# Patient Record
Sex: Male | Born: 1955 | Race: White | Hispanic: No | Marital: Single | Smoking: Never smoker
Health system: Southern US, Community
[De-identification: ages and names within clinical notes are randomized; demographics above are authoritative.]

## PROBLEM LIST (undated history)

## (undated) DIAGNOSIS — L409 Psoriasis, unspecified: Secondary | ICD-10-CM

## (undated) DIAGNOSIS — F32A Depression, unspecified: Secondary | ICD-10-CM

## (undated) DIAGNOSIS — I10 Essential (primary) hypertension: Secondary | ICD-10-CM

## (undated) DIAGNOSIS — J449 Chronic obstructive pulmonary disease, unspecified: Secondary | ICD-10-CM

## (undated) DIAGNOSIS — E119 Type 2 diabetes mellitus without complications: Secondary | ICD-10-CM

## (undated) DIAGNOSIS — G4733 Obstructive sleep apnea (adult) (pediatric): Secondary | ICD-10-CM

## (undated) HISTORY — DX: Chronic obstructive pulmonary disease, unspecified: J44.9

## (undated) HISTORY — DX: Obstructive sleep apnea (adult) (pediatric): G47.33

## (undated) HISTORY — PX: HERNIA REPAIR: SHX51

## (undated) HISTORY — DX: Psoriasis, unspecified: L40.9

## (undated) HISTORY — DX: Depression, unspecified: F32.A

---

## 2021-03-10 ENCOUNTER — Encounter (INDEPENDENT_AMBULATORY_CARE_PROVIDER_SITE_OTHER): Payer: Self-pay | Admitting: *Deleted

## 2021-06-13 ENCOUNTER — Encounter (INDEPENDENT_AMBULATORY_CARE_PROVIDER_SITE_OTHER): Payer: Self-pay | Admitting: *Deleted

## 2021-06-15 ENCOUNTER — Encounter (HOSPITAL_COMMUNITY)
Admission: EM | Disposition: A | Discharge status: Home / Self Care | Payer: Self-pay | Source: Home / Self Care | Attending: Emergency Medicine

## 2021-06-15 ENCOUNTER — Encounter (HOSPITAL_COMMUNITY): Payer: Self-pay

## 2021-06-15 ENCOUNTER — Emergency Department (HOSPITAL_COMMUNITY): Payer: Medicare (Managed Care) | Admitting: Anesthesiology

## 2021-06-15 ENCOUNTER — Ambulatory Visit (HOSPITAL_COMMUNITY)
Admission: EM | Admit: 2021-06-15 | Discharge: 2021-06-15 | Disposition: A | Discharge status: Home / Self Care | Payer: Medicare (Managed Care) | Attending: Emergency Medicine | Admitting: Emergency Medicine

## 2021-06-15 ENCOUNTER — Other Ambulatory Visit (INDEPENDENT_AMBULATORY_CARE_PROVIDER_SITE_OTHER): Payer: Self-pay | Admitting: Gastroenterology

## 2021-06-15 DIAGNOSIS — T18128A Food in esophagus causing other injury, initial encounter: Secondary | ICD-10-CM | POA: Diagnosis present

## 2021-06-15 DIAGNOSIS — K269 Duodenal ulcer, unspecified as acute or chronic, without hemorrhage or perforation: Secondary | ICD-10-CM | POA: Insufficient documentation

## 2021-06-15 DIAGNOSIS — Z79899 Other long term (current) drug therapy: Secondary | ICD-10-CM | POA: Insufficient documentation

## 2021-06-15 DIAGNOSIS — K219 Gastro-esophageal reflux disease without esophagitis: Secondary | ICD-10-CM | POA: Insufficient documentation

## 2021-06-15 DIAGNOSIS — K3189 Other diseases of stomach and duodenum: Secondary | ICD-10-CM | POA: Diagnosis not present

## 2021-06-15 DIAGNOSIS — Z7984 Long term (current) use of oral hypoglycemic drugs: Secondary | ICD-10-CM | POA: Insufficient documentation

## 2021-06-15 DIAGNOSIS — K209 Esophagitis, unspecified without bleeding: Secondary | ICD-10-CM | POA: Diagnosis not present

## 2021-06-15 DIAGNOSIS — K2950 Unspecified chronic gastritis without bleeding: Secondary | ICD-10-CM | POA: Diagnosis not present

## 2021-06-15 DIAGNOSIS — E119 Type 2 diabetes mellitus without complications: Secondary | ICD-10-CM | POA: Diagnosis not present

## 2021-06-15 DIAGNOSIS — Z20822 Contact with and (suspected) exposure to covid-19: Secondary | ICD-10-CM | POA: Diagnosis not present

## 2021-06-15 DIAGNOSIS — T18108A Unspecified foreign body in esophagus causing other injury, initial encounter: Secondary | ICD-10-CM

## 2021-06-15 DIAGNOSIS — X58XXXA Exposure to other specified factors, initial encounter: Secondary | ICD-10-CM | POA: Insufficient documentation

## 2021-06-15 DIAGNOSIS — I10 Essential (primary) hypertension: Secondary | ICD-10-CM | POA: Diagnosis not present

## 2021-06-15 HISTORY — DX: Essential (primary) hypertension: I10

## 2021-06-15 HISTORY — PX: ESOPHAGOGASTRODUODENOSCOPY (EGD) WITH PROPOFOL: SHX5813

## 2021-06-15 HISTORY — DX: Type 2 diabetes mellitus without complications: E11.9

## 2021-06-15 HISTORY — PX: BIOPSY: SHX5522

## 2021-06-15 LAB — CBC
HCT: 43.7 % (ref 39.0–52.0)
Hemoglobin: 14.9 g/dL (ref 13.0–17.0)
MCH: 30.8 pg (ref 26.0–34.0)
MCHC: 34.1 g/dL (ref 30.0–36.0)
MCV: 90.3 fL (ref 80.0–100.0)
Platelets: 227 10*3/uL (ref 150–400)
RBC: 4.84 MIL/uL (ref 4.22–5.81)
RDW: 12.8 % (ref 11.5–15.5)
WBC: 9.8 10*3/uL (ref 4.0–10.5)
nRBC: 0 % (ref 0.0–0.2)

## 2021-06-15 LAB — RESP PANEL BY RT-PCR (FLU A&B, COVID) ARPGX2
Influenza A by PCR: NEGATIVE
Influenza B by PCR: NEGATIVE
SARS Coronavirus 2 by RT PCR: NEGATIVE

## 2021-06-15 LAB — BASIC METABOLIC PANEL
Anion gap: 10 (ref 5–15)
BUN: 14 mg/dL (ref 8–23)
CO2: 21 mmol/L — ABNORMAL LOW (ref 22–32)
Calcium: 9 mg/dL (ref 8.9–10.3)
Chloride: 110 mmol/L (ref 98–111)
Creatinine, Ser: 0.76 mg/dL (ref 0.61–1.24)
GFR, Estimated: >60 mL/min (ref >60)
Glucose, Bld: 117 mg/dL — ABNORMAL HIGH (ref 70–99)
Potassium: 3.5 mmol/L (ref 3.5–5.1)
Sodium: 141 mmol/L (ref 135–145)

## 2021-06-15 SURGERY — ESOPHAGOGASTRODUODENOSCOPY (EGD) WITH PROPOFOL
Anesthesia: General

## 2021-06-15 MED ORDER — EPHEDRINE SULFATE-NACL 50-0.9 MG/10ML-% IV SOSY
PREFILLED_SYRINGE | INTRAVENOUS | Status: DC | PRN
  Administered 2021-06-15: 5 mg via INTRAVENOUS

## 2021-06-15 MED ORDER — ROCURONIUM BROMIDE 10 MG/ML (PF) SYRINGE
PREFILLED_SYRINGE | INTRAVENOUS | Status: DC | PRN
  Administered 2021-06-15: 30 mg via INTRAVENOUS

## 2021-06-15 MED ORDER — PROPOFOL 10 MG/ML IV BOLUS
INTRAVENOUS | Status: AC
  Filled 2021-06-15: qty 20

## 2021-06-15 MED ORDER — ROCURONIUM BROMIDE 10 MG/ML (PF) SYRINGE
PREFILLED_SYRINGE | INTRAVENOUS | Status: AC
  Filled 2021-06-15: qty 10

## 2021-06-15 MED ORDER — FENTANYL CITRATE (PF) 100 MCG/2ML IJ SOLN
INTRAMUSCULAR | Status: AC
  Filled 2021-06-15: qty 2

## 2021-06-15 MED ORDER — ESMOLOL HCL 100 MG/10ML IV SOLN
INTRAVENOUS | Status: DC | PRN
  Administered 2021-06-15: 40 mg via INTRAVENOUS

## 2021-06-15 MED ORDER — LIDOCAINE HCL (CARDIAC) PF 100 MG/5ML IV SOSY
PREFILLED_SYRINGE | INTRAVENOUS | Status: DC | PRN
  Administered 2021-06-15: 100 mg via INTRATRACHEAL
  Administered 2021-06-15: 30 mg via INTRATRACHEAL

## 2021-06-15 MED ORDER — DEXAMETHASONE SODIUM PHOSPHATE 4 MG/ML IJ SOLN
INTRAMUSCULAR | Status: DC | PRN
  Administered 2021-06-15: 8 mg via INTRAVENOUS

## 2021-06-15 MED ORDER — OMEPRAZOLE 40 MG PO CPDR: 40.0000 mg | DELAYED_RELEASE_CAPSULE | Freq: Two times a day (BID) | ORAL | 1 refills | Status: DC

## 2021-06-15 MED ORDER — FENTANYL CITRATE (PF) 100 MCG/2ML IJ SOLN
INTRAMUSCULAR | Status: DC | PRN
  Administered 2021-06-15: 100 ug via INTRAVENOUS

## 2021-06-15 MED ORDER — SUCCINYLCHOLINE CHLORIDE 200 MG/10ML IV SOSY
PREFILLED_SYRINGE | INTRAVENOUS | Status: AC
Start: 2021-06-15 — End: ?
  Filled 2021-06-15: qty 10

## 2021-06-15 MED ORDER — SUGAMMADEX SODIUM 500 MG/5ML IV SOLN
INTRAVENOUS | Status: DC | PRN
  Administered 2021-06-15: 362.8 mg via INTRAVENOUS

## 2021-06-15 MED ORDER — DEXAMETHASONE SODIUM PHOSPHATE 4 MG/ML IJ SOLN
INTRAMUSCULAR | Status: AC
  Filled 2021-06-15: qty 1

## 2021-06-15 MED ORDER — GLYCOPYRROLATE PF 0.2 MG/ML IJ SOSY
PREFILLED_SYRINGE | INTRAMUSCULAR | Status: DC | PRN
  Administered 2021-06-15: .2 mg via INTRAVENOUS

## 2021-06-15 MED ORDER — SUCCINYLCHOLINE CHLORIDE 200 MG/10ML IV SOSY
PREFILLED_SYRINGE | INTRAVENOUS | Status: DC | PRN
  Administered 2021-06-15: 200 mg via INTRAVENOUS

## 2021-06-15 MED ORDER — ONDANSETRON HCL 4 MG/2ML IJ SOLN
INTRAMUSCULAR | Status: DC | PRN
  Administered 2021-06-15: 4 mg via INTRAVENOUS

## 2021-06-15 MED ORDER — GLUCAGON HCL RDNA (DIAGNOSTIC) 1 MG IJ SOLR
1.0000 mg | Freq: Once | INTRAMUSCULAR | Status: AC
  Administered 2021-06-15: 15:00:00 1 mg via INTRAVENOUS
  Filled 2021-06-15: qty 1

## 2021-06-15 MED ORDER — PROPOFOL 10 MG/ML IV BOLUS
INTRAVENOUS | Status: DC | PRN
  Administered 2021-06-15: 200 mg via INTRAVENOUS

## 2021-06-15 MED ORDER — LIDOCAINE HCL (PF) 2 % IJ SOLN
INTRAMUSCULAR | Status: AC
  Filled 2021-06-15: qty 5

## 2021-06-15 MED ORDER — LACTATED RINGERS IV SOLN: INTRAVENOUS | Status: DC | PRN

## 2021-06-15 MED ORDER — PHENYLEPHRINE 40 MCG/ML (10ML) SYRINGE FOR IV PUSH (FOR BLOOD PRESSURE SUPPORT)
PREFILLED_SYRINGE | INTRAVENOUS | Status: DC | PRN
  Administered 2021-06-15: 80 ug via INTRAVENOUS

## 2021-06-15 MED ORDER — ONDANSETRON HCL 4 MG/2ML IJ SOLN
INTRAMUSCULAR | Status: AC
  Filled 2021-06-15: qty 2

## 2021-06-15 MED ORDER — LACTATED RINGERS IV BOLUS: 1000.0000 mL | Freq: Once | INTRAVENOUS | Status: DC

## 2021-06-15 NOTE — Op Note (Signed)
[facility] Patient Name: Carl Jimenez Procedure Date: 06/15/2021 5:09 PM MRN: [id number] Date of Birth: [birth year] Attending MD: Maylon Peppers ,  CSN: [id number] Age: 65 Admit Type: Inpatient Procedure:                Upper GI endoscopy Indications:              Foreign body in the esophagus Providers:                Maylon Peppers, Lurline Del, RN, Casimer Bilis, Technician Referring MD:              Medicines:                General Anesthesia Complications:            No immediate complications. Estimated Blood Loss:     Estimated blood loss: none. Procedure:                Pre-Anesthesia Assessment:                           - Prior to the procedure, a History and Physical                            was performed, and patient medications, allergies                            and sensitivities were reviewed. The patient's                            tolerance of previous anesthesia was reviewed.                           - The risks and benefits of the procedure and the                            sedation options and risks were discussed with the                            patient. All questions were answered and informed                            consent was obtained.                           After obtaining informed consent, the endoscope was                            passed under direct vision. Throughout the                            procedure, the patient's blood pressure, pulse, and                            oxygen saturations were monitored continuously. The  [id number] scope was introduced through the                            mouth, and advanced to the second part of duodenum.                            The upper GI endoscopy was accomplished without                            difficulty. The patient tolerated the procedure                            well. Scope In: 5:26:11 PM Scope  Out: 6:06:23 PM Total Procedure Duration: 0 hours 40 minutes 12 seconds  Findings:      Food was found in the lower third of the esophagus. Removal of food was       accomplished with a combination of cold snare, transparent cap and Roth       net. The last portion of the food bolus was gently pushed to the gastric       cavity without resistance.      Moderately severe esophagitis with no bleeding was found in the lower       third of the esophagus. Biopsies were obtained from the proximal and       distal esophagus with cold forceps for histology.      Diffuse mildly erythematous mucosa was found in the entire examined       stomach. Biopsies were taken with a cold forceps for Helicobacter pylori       testing.      A few localized erosions without bleeding were found in the first       portion of the duodenum. Impression:               - Food in the lower third of the esophagus. Removal                            was successful.                           - Moderately severe esophagitis with no bleeding.                            Biopsied.                           - Erythematous mucosa in the stomach. Biopsied.                           - Duodenal erosions without bleeding. Moderate Sedation:      Per Anesthesia Care Recommendation:           - Discharge patient to home (ambulatory).                           - Await pathology results.                           - Repeat upper endoscopy  in 2 months for                            surveillance.                           - Full liquid diet today, advance to a soft diet                            tomorrow - no meat, only ground meat until repeat                            endoscopy is performed.                           - Use Prilosec (omeprazole) 40 mg PO BID for 3                            months. Procedure Code(s):        --- Professional ---                           [id number], Esophagogastroduodenoscopy, flexible,                             transoral; with removal of foreign body(s)                           43239, Esophagogastroduodenoscopy, flexible,                            transoral; with biopsy, single or multiple Diagnosis Code(s):        --- Professional ---                           A83.419Q, Food in esophagus causing other injury,                            initial encounter                           K20.90, Esophagitis, unspecified without bleeding                           K31.89, Other diseases of stomach and duodenum                           K26.9, Duodenal ulcer, unspecified as acute or                            chronic, without hemorrhage or perforation                           T18.108A, Unspecified foreign body in esophagus                            causing other injury,  initial encounter CPT copyright 2019 American [employer] Association. All rights reserved. The codes documented in this report are preliminary and upon coder review may  be revised to meet current compliance requirements. Maylon Peppers, MD Maylon Peppers,  06/15/2021 6:15:06 PM This report has been signed electronically. Number of Addenda: 0

## 2021-06-15 NOTE — Anesthesia Postprocedure Evaluation (Signed)
Anesthesia Post Note  Patient: Homar Weinkauf  Procedure(s) Performed: ESOPHAGOGASTRODUODENOSCOPY (EGD) WITH PROPOFOL BIOPSY  Patient location during evaluation: PACU Anesthesia Type: General Level of consciousness: awake and alert and oriented Pain management: pain level controlled Vital Signs Assessment: post-procedure vital signs reviewed and stable Respiratory status: spontaneous breathing, nonlabored ventilation and respiratory function stable Cardiovascular status: blood pressure returned to baseline and stable Postop Assessment: no apparent nausea or vomiting Anesthetic complications: no   No notable events documented.   Last Vitals:  Vitals:   06/15/21 1827 06/15/21 1830  BP: (!) 142/93 (!) 152/89  Pulse: 73 74  Resp: 14 (!) 22  Temp: 36.5 C   SpO2: 97% 97%    Last Pain:  Vitals:   06/15/21 1827  TempSrc:   PainSc: 0-No pain                 Khadijatou Borak C Zaharah Amir

## 2021-06-15 NOTE — Anesthesia Preprocedure Evaluation (Addendum)
Anesthesia Evaluation  Patient identified by MRN, date of birth, ID band Patient awake    Reviewed: Allergy & Precautions, NPO status , Patient's Chart, lab work & pertinent test results  Airway Mallampati: II  TM Distance: >3 FB Neck ROM: Full    Dental  (+) Edentulous Upper, Edentulous Lower   Pulmonary neg pulmonary ROS,    Pulmonary exam normal breath sounds clear to auscultation       Cardiovascular hypertension, Normal cardiovascular exam Rhythm:Regular Rate:Normal     Neuro/Psych negative neurological ROS  negative psych ROS   GI/Hepatic negative GI ROS, Neg liver ROS,   Endo/Other  diabetes, Well Controlled, Type 2, Oral Hypoglycemic Agents  Renal/GU negative Renal ROS  negative genitourinary   Musculoskeletal negative musculoskeletal ROS (+)   Abdominal   Peds negative pediatric ROS (+)  Hematology negative hematology ROS (+)   Anesthesia Other Findings   Reproductive/Obstetrics negative OB ROS                             Anesthesia Physical Anesthesia Plan  ASA: 2 and emergent  Anesthesia Plan: General   Post-op Pain Management:    Induction: Intravenous, Cricoid pressure planned and Rapid sequence  PONV Risk Score and Plan: 2 and Ondansetron and Dexamethasone  Airway Management Planned: Oral ETT  Additional Equipment:   Intra-op Plan:   Post-operative Plan: Extubation in OR  Informed Consent:     Dental advisory given  Plan Discussed with: Surgeon  Anesthesia Plan Comments: (Discussed plan with patient and he gave verbal consent for anesthesia and verbal consent for the procedure was given to Dr. Amil Amen.)       Anesthesia Quick Evaluation

## 2021-06-15 NOTE — ED Provider Notes (Signed)
[facility] EMERGENCY DEPARTMENT Provider Note   CSN: [id number] Arrival date & time: 06/15/21  1356     History No chief complaint on file.   Carl Jimenez is a 65 y.o. male.  HPI     Carl Jimenez is a 65 y.o. male with past medical history of hypertension and type 2 diabetes who presents to the Emergency Department complaining of food bolus of his throat.  He states he was eating barbecue last night around 7 PM when he swallowed a piece of pork that he feels is stuck in his esophagus.  He states he has had this happen several times in the past and is scheduled to have his esophagus stretched in 3 weeks.  He attempted to drink water and tea without relief.  States he is unable to tolerate his own saliva.  Has not been able to tolerate liquids or solid food since last evening.  He denies any chest pain or shortness of breath.  Past Medical History:  Diagnosis Date   Diabetes mellitus without complication (HCC)    Hypertension     There are no problems to display for this patient.   Past Surgical History:  Procedure Laterality Date   HERNIA REPAIR        No family history on file.  Social History   Tobacco Use   Smoking status: Never   Smokeless tobacco: Never  Substance Use Topics   Drug use: Never    Home Medications Prior to Admission medications   Not on File    Allergies    Patient has no allergy information on record.  Review of Systems   Review of Systems  Constitutional:  Negative for chills, fatigue and fever.  HENT:  Positive for trouble swallowing. Negative for sore throat and voice change.   Respiratory:  Negative for shortness of breath and wheezing.   Cardiovascular:  Negative for chest pain and palpitations.  Gastrointestinal:  Positive for vomiting. Negative for abdominal pain and nausea.  Musculoskeletal:  Negative for back pain, myalgias, neck pain and neck stiffness.  Skin:  Negative for rash.  Neurological:  Negative for dizziness,  weakness and numbness.  Hematological:  Does not bruise/bleed easily.   Physical Exam Updated Vital Signs BP (!) 158/92 (BP Location: Right Arm)    Pulse 60    Temp 98 F (36.7 C) (Oral)    Resp 16    Ht 6' (1.829 m)    Wt 90.7 kg    SpO2 99%    BMI 27.12 kg/m   Physical Exam Vitals and nursing note reviewed.  Constitutional:      General: He is not in acute distress.    Appearance: Normal appearance. He is not ill-appearing or toxic-appearing.  HENT:     Mouth/Throat:     Mouth: Mucous membranes are moist.     Pharynx: Oropharynx is clear.     Comments: No erythema or edema of the oropharynx.  Uvula is midline nonedematous. Cardiovascular:     Rate and Rhythm: Normal rate and regular rhythm.     Pulses: Normal pulses.  Pulmonary:     Effort: Pulmonary effort is normal.  Abdominal:     General: There is no distension.     Palpations: Abdomen is soft.     Tenderness: There is no abdominal tenderness.  Musculoskeletal:        General: Normal range of motion.     Cervical back: Normal range of motion.  Skin:  General: Skin is warm.  Neurological:     General: No focal deficit present.     Mental Status: He is alert.     Sensory: No sensory deficit.     Motor: No weakness.    ED Results / Procedures / Treatments   Labs (all labs ordered are listed, but only abnormal results are displayed) Labs Reviewed  RESP PANEL BY RT-PCR (FLU A&B, COVID) [id number]    EKG None  Radiology No results found.  Procedures Procedures   Medications Ordered in ED Medications  glucagon (human recombinant) (GLUCAGEN) injection 1 mg (has no administration in time range)    ED Course  I have reviewed the triage vital signs and the nursing notes.  Pertinent labs & imaging results that were available during my care of the patient were reviewed by me and considered in my medical decision making (see chart for details).    MDM Rules/Calculators/A&P                          Patient  here for evaluation of possible food bolus of the esophagus.  States he swallowed a piece of pork last evening at 7 PM and has been unable to tolerate any liquids or foods since that time.  He feels that something is stuck in his throat.  Reports history of same but has typically been able to remove the followed bolus himself.  He tried drinking liquids without relief.  No solid food intake since 7 PM yesterday.  States he is scheduled to have his esophagus stretched in 3 weeks.  On exam, patient is well-appearing nontoxic.  Vital signs reviewed.  No respiratory distress noted.  He is unable to tolerate his own secretions, spitting saliva into an emesis bag.  Will try IV glucagon.  He will likely need endoscopy and I will consult GI.  1520 on recheck, patient has received IV glucagon without improvement.  Unable to tolerate water.  Will consult GI.  Discussed findings with GI, Dr. Levon Hedger who will see patient and likely perform endoscopy.      Final Clinical Impression(s) / ED Diagnoses Final diagnoses:  Food impaction of esophagus, initial encounter    Rx / DC Orders ED Discharge Orders     None        Rosey Bath 06/15/21 1549    Gerhard Munch, MD 06/16/21 1302

## 2021-06-15 NOTE — ED Notes (Signed)
Bedside report given to endo team

## 2021-06-15 NOTE — Transfer of Care (Signed)
Immediate Anesthesia Transfer of Care Note  Patient: Carl Jimenez  Procedure(s) Performed: ESOPHAGOGASTRODUODENOSCOPY (EGD) WITH PROPOFOL BIOPSY  Patient Location: PACU  Anesthesia Type:General  Level of Consciousness: awake, alert , oriented and sedated  Airway & Oxygen Therapy: Patient Spontanous Breathing and Patient connected to nasal cannula oxygen  Post-op Assessment: Report given to RN and Post -op Vital signs reviewed and stable  Post vital signs: Reviewed and stable  Last Vitals:  Vitals Value Taken Time  BP 142/93 06/15/21 1827  Temp 36.5 C 06/15/21 1827  Pulse 74 06/15/21 1829  Resp 22 06/15/21 1829  SpO2 97 % 06/15/21 1829  Vitals shown include unvalidated device data.  Last Pain:  Vitals:   06/15/21 1704  TempSrc:   PainSc: 0-No pain      Patients Stated Pain Goal: 0 (06/15/21 1407)  Complications: No notable events documented.

## 2021-06-15 NOTE — Consult Note (Addendum)
Carl Jimenez, M.D. Gastroenterology & Hepatology                                           Patient Name: Carl Jimenez Account #: @FLAACCTNO @   MRN: Admission Date: 06/15/2021 Date of Evaluation:  06/15/2021 Time of Evaluation: 4:34 PM   Referring Physician: 06/17/2021, MD  Chief Complaint: Food impaction  HPI:  This is a 65 y.o. male with history of diabetes and hypertension, coming to the hospital for  evaluation after presenting an episode of food impaction.   The patient reports he was eating his supper yesterday night which included some pork and states that he could not swallow any food or saliva since then.  He reports that he felt the food got stuck in the retrosternal area.  Drooling: Yes Able to swallow food: No Able to drink liquids"no Previous reflux symptoms: Yes, he has presented recurrent episodes of heartburn for which he reports that he takes over-the-counter "antacids" but he is not sure which one he takes.  States that he has heartburn on a daily basis and frequent regurgitation Weight changes: No Seasonal allegies: No  Previous episodes: He has presented recurrent dysphagia to solids for which he has an appointment scheduled at the end of January 2023 with our clinic.  Medications currently used: He does not know which medication he takes but his medical chart has famotidine 10 mg twice daily listed as one of his medicines.  Previous EGD: Never Previous colonoscopy: Never   In the ER, his vital signs were stable. He was protecting his airway.  Did not have any labs ordered. COVID and flu test came back negative.  Past Medical History: SEE CHRONIC ISSSUES: Past Medical History:  Diagnosis Date   Diabetes mellitus without complication (HCC)    Hypertension    Past Surgical History:  Past Surgical History:  Procedure Laterality Date   HERNIA REPAIR     Family History: No family history on file. Social History:  Social History    Tobacco Use   Smoking status: Never   Smokeless tobacco: Never  Substance Use Topics   Drug use: Never    Home Medications:  Prior to Admission medications   Medication Sig Start Date End Date Taking? Authorizing Provider  albuterol (VENTOLIN HFA) 108 (90 Base) MCG/ACT inhaler Inhale into the lungs. 05/20/21  Yes [provider]  citalopram (CELEXA) 20 MG tablet Take 20 mg by mouth daily. 04/30/21  Yes [provider]  clobetasol cream (TEMOVATE) 0.05 % Apply topically. 03/09/21  Yes [provider]  famotidine (PEPCID) 10 MG tablet Take 10 mg by mouth 2 (two) times daily.   Yes [provider]  melatonin 3 MG TABS tablet Take 3 mg by mouth at bedtime.   Yes [provider]  metFORMIN (GLUCOPHAGE-XR) 500 MG 24 hr tablet Take 500 mg by mouth 2 (two) times daily. 03/09/21  Yes [provider]  Multiple Vitamin (MULTIVITAMIN) tablet Take 1 tablet by mouth daily.   Yes [provider]  rosuvastatin (CRESTOR) 20 MG tablet Take 20 mg by mouth at bedtime. 04/30/21  Yes [provider]    Inpatient Medications: No current facility-administered medications for this encounter.  Current Outpatient Medications:    albuterol (VENTOLIN HFA) 108 (90 Base) MCG/ACT inhaler, Inhale into the lungs., Disp: , Rfl:    citalopram (CELEXA) 20 MG  tablet, Take 20 mg by mouth daily., Disp: , Rfl:    clobetasol cream (TEMOVATE) 0.05 %, Apply topically., Disp: , Rfl:    famotidine (PEPCID) 10 MG tablet, Take 10 mg by mouth 2 (two) times daily., Disp: , Rfl:    melatonin 3 MG TABS tablet, Take 3 mg by mouth at bedtime., Disp: , Rfl:    metFORMIN (GLUCOPHAGE-XR) 500 MG 24 hr tablet, Take 500 mg by mouth 2 (two) times daily., Disp: , Rfl:    Multiple Vitamin (MULTIVITAMIN) tablet, Take 1 tablet by mouth daily., Disp: , Rfl:    rosuvastatin (CRESTOR) 20 MG tablet, Take 20 mg by mouth at bedtime., Disp: , Rfl:  Allergies: Patient has no allergy  information on record.  Complete Review of Systems: GENERAL: negative for malaise, night sweats HEENT: No changes in hearing or vision, no nose bleeds or other nasal problems. NECK: Negative for lumps, goiter, pain and significant neck swelling RESPIRATORY: Negative for cough, wheezing CARDIOVASCULAR: Negative for chest pain, leg swelling, palpitations, orthopnea GI: SEE HPI MUSCULOSKELETAL: Negative for joint pain or swelling, back pain, and muscle pain. SKIN: Negative for lesions, rash PSYCH: Negative for sleep disturbance, mood disorder and recent psychosocial stressors. HEMATOLOGY Negative for prolonged bleeding, bruising easily, and swollen nodes. ENDOCRINE: Negative for cold or heat intolerance, polyuria, polydipsia and goiter. NEURO: negative for tremor, gait imbalance, syncope and seizures. The remainder of the review of systems is noncontributory.  Physical Exam: BP (!) 182/88    Pulse (!) 52    Temp 98 F (36.7 C) (Oral)    Resp 16    Ht 6' (1.829 m)    Wt 90.7 kg    SpO2 96%    BMI 27.12 kg/m  GENERAL: The patient is AO x3, in no acute distress. HEENT: Head is normocephalic and atraumatic. EOMI are intact. Mouth is well hydrated and without lesions. NECK: Supple. No masses LUNGS: Clear to auscultation. No presence of rhonchi/wheezing/rales. Adequate chest expansion HEART: RRR, normal s1 and s2. ABDOMEN: Soft, nontender, no guarding, no peritoneal signs, and nondistended. BS +. No masses. EXTREMITIES: Without any cyanosis, clubbing, rash, lesions or edema. NEUROLOGIC: AOx3, no focal motor deficit. SKIN: no jaundice, no rashes  Laboratory Data CBC: No results found for: WBC, RBC, HGB, HCT, PLT, MCV, MCH, MCHC, RDW, LYMPHSABS, MONOABS, EOSABS, BASOSABS COAG: No results found for: INR, PROTIME  BMP: No flowsheet data found.  HEPATIC: No flowsheet data found.  CARDIAC: No results found for: CKTOTAL, CKMB, CKMBINDEX, TROPONINI   Imaging: I personally reviewed and  interpreted the available imaging.  Assessment & Plan: Carl Jimenez is a 65 y.o. male with history of diabetes and hypertension, coming to the hospital for  evaluation after presenting an episode of food impaction - first episode. The patient has not presented any previous red flag signs. DDx includes possible peptic strictures or esophagitis due to long-term history of GERD vs less likely EoE. For now, will keep the patient nothing by mouth until the procedure is performed. He will require general anesthesia due to the risk of aspiration.  - Keep NPO - Emergent EGD - Anesthesia evaluation for general anesthesia - high risk of aspiration - Patient gave verbal consent.  Dolores Frame, MD   Dolores Frame, MD Gastroenterology and Hepatology [facility]

## 2021-06-15 NOTE — Addendum Note (Signed)
Addendum  created 06/15/21 1908 by Molli Barrows, MD   Clinical Note Signed

## 2021-06-15 NOTE — ED Notes (Signed)
PA attempting PO challenge

## 2021-06-15 NOTE — ED Triage Notes (Signed)
Pt ate BBQ last night at 8pm and has a piece lodged in his throat he reports.  Pt alert breathing WNL talking in triage, not coughing, pt skin warm and dry.  99%RA

## 2021-06-15 NOTE — Anesthesia Procedure Notes (Signed)
Procedure Name: Intubation Date/Time: 06/15/2021 5:23 PM Performed by: Denese Killings, MD Pre-anesthesia Checklist: Patient identified, Emergency Drugs available, Suction available and Patient being monitored Patient Re-evaluated:Patient Re-evaluated prior to induction Oxygen Delivery Method: Circle system utilized Preoxygenation: Pre-oxygenation with 100% oxygen Induction Type: IV induction, Rapid sequence and Cricoid Pressure applied Ventilation: Mask ventilation without difficulty Laryngoscope Size: Mac and 3 Grade View: Grade II Tube type: Oral Tube size: 7.5 mm Number of attempts: 1 Airway Equipment and Method: Stylet and Oral airway Placement Confirmation: ETT inserted through vocal cords under direct vision, positive ETCO2 and breath sounds checked- equal and bilateral Secured at: 23 cm Tube secured with: Tape Dental Injury: Teeth and Oropharynx as per pre-operative assessment

## 2021-06-15 NOTE — Brief Op Note (Signed)
06/15/2021  6:08 PM  PATIENT:  Carl Jimenez  65 y.o. male  PRE-OPERATIVE DIAGNOSIS:  food impaction  POST-OPERATIVE DIAGNOSIS:  food in esophagus;  PROCEDURE:  Procedure(s) with comments: ESOPHAGOGASTRODUODENOSCOPY (EGD) WITH PROPOFOL (N/A) BIOPSY - gastric, distal, esophageal, mid-esophageal  SURGEON:  Surgeon(s) and Role:    * Dolores Frame, MD - Primary  Food was found in the lower third of the esophagus.  Removal of food was accomplished with a combination of cold snare, transparent cap and Roth net. The last portion of the food bolus was gently pushed to the gastric cavity without resistance. Moderately severe esophagitis with no bleeding was found in the lower third of the esophagus.  Biopsies were obtained from the proximal and distal esophagus with cold forceps for histology. Diffuse mildly erythematous mucosa was found in the entire examined stomach.  Biopsies were taken with a cold forceps for Helicobacter pylori testing. A few localized erosions without bleeding were found in the first portion of the duodenum.   RECOMMENDATIONS: - Discharge patient to home (ambulatory).  - Await pathology results.  - Repeat upper endoscopy in 2 months for surveillance.  - Full liquid diet today, advance to a soft diet tomorrow - no meat, only ground meat until repeat endoscopy is performed.  - Use Prilosec (omeprazole) 40 mg PO BID for 3 months.  - Follow up in GI clinic as previously scheduled. - Stop using NSAIDs such as Aleve, ibuprofen, naproxen, Motrin, Voltaren or Advil (even the topical ones)  [name], MD Gastroenterology and Hepatology [facility]

## 2021-06-16 LAB — GLUCOSE, CAPILLARY: Glucose-Capillary: 86 mg/dL (ref 70–99)

## 2021-06-29 ENCOUNTER — Other Ambulatory Visit (INDEPENDENT_AMBULATORY_CARE_PROVIDER_SITE_OTHER): Payer: Self-pay | Admitting: Gastroenterology

## 2021-06-29 DIAGNOSIS — B9681 Helicobacter pylori [H. pylori] as the cause of diseases classified elsewhere: Secondary | ICD-10-CM

## 2021-06-29 LAB — SURGICAL PATHOLOGY

## 2021-06-29 MED ORDER — BISMUTH 262 MG PO CHEW: 2.0000 | CHEWABLE_TABLET | Freq: Four times a day (QID) | ORAL | 0 refills | Status: DC

## 2021-06-29 MED ORDER — TETRACYCLINE HCL 500 MG PO CAPS: 500.0000 mg | ORAL_CAPSULE | Freq: Four times a day (QID) | ORAL | 0 refills | Status: AC

## 2021-06-29 MED ORDER — METRONIDAZOLE 500 MG PO TABS: 500.0000 mg | ORAL_TABLET | Freq: Three times a day (TID) | ORAL | 0 refills | Status: AC

## 2021-06-30 ENCOUNTER — Encounter (HOSPITAL_COMMUNITY): Payer: Self-pay | Admitting: Gastroenterology

## 2021-07-17 ENCOUNTER — Emergency Department (HOSPITAL_COMMUNITY): Payer: Medicare Other

## 2021-07-17 ENCOUNTER — Encounter (HOSPITAL_COMMUNITY): Payer: Self-pay

## 2021-07-17 ENCOUNTER — Emergency Department (HOSPITAL_COMMUNITY)
Admission: EM | Admit: 2021-07-17 | Discharge: 2021-07-17 | Disposition: A | Discharge status: Home / Self Care | Payer: Medicare Other | Attending: Emergency Medicine | Admitting: Emergency Medicine

## 2021-07-17 ENCOUNTER — Other Ambulatory Visit: Payer: Self-pay

## 2021-07-17 DIAGNOSIS — R110 Nausea: Secondary | ICD-10-CM | POA: Diagnosis not present

## 2021-07-17 DIAGNOSIS — R944 Abnormal results of kidney function studies: Secondary | ICD-10-CM | POA: Insufficient documentation

## 2021-07-17 DIAGNOSIS — K5900 Constipation, unspecified: Secondary | ICD-10-CM | POA: Insufficient documentation

## 2021-07-17 DIAGNOSIS — N132 Hydronephrosis with renal and ureteral calculous obstruction: Secondary | ICD-10-CM | POA: Insufficient documentation

## 2021-07-17 DIAGNOSIS — R109 Unspecified abdominal pain: Secondary | ICD-10-CM | POA: Diagnosis present

## 2021-07-17 DIAGNOSIS — K5730 Diverticulosis of large intestine without perforation or abscess without bleeding: Secondary | ICD-10-CM | POA: Diagnosis not present

## 2021-07-17 DIAGNOSIS — N201 Calculus of ureter: Secondary | ICD-10-CM | POA: Diagnosis not present

## 2021-07-17 LAB — CBC WITH DIFFERENTIAL/PLATELET
Abs Immature Granulocytes: 0.04 10*3/uL (ref 0.00–0.07)
Basophils Absolute: 0.1 10*3/uL (ref 0.0–0.1)
Basophils Relative: 1 %
Eosinophils Absolute: 0.2 10*3/uL (ref 0.0–0.5)
Eosinophils Relative: 2 %
HCT: 44.5 % (ref 39.0–52.0)
Hemoglobin: 15 g/dL (ref 13.0–17.0)
Immature Granulocytes: 0 %
Lymphocytes Relative: 27 %
Lymphs Abs: 3 10*3/uL (ref 0.7–4.0)
MCH: 30.2 pg (ref 26.0–34.0)
MCHC: 33.7 g/dL (ref 30.0–36.0)
MCV: 89.5 fL (ref 80.0–100.0)
Monocytes Absolute: 0.9 10*3/uL (ref 0.1–1.0)
Monocytes Relative: 8 %
Neutro Abs: 6.7 10*3/uL (ref 1.7–7.7)
Neutrophils Relative %: 62 %
Platelets: 250 10*3/uL (ref 150–400)
RBC: 4.97 MIL/uL (ref 4.22–5.81)
RDW: 13.3 % (ref 11.5–15.5)
WBC: 10.9 10*3/uL — ABNORMAL HIGH (ref 4.0–10.5)
nRBC: 0 % (ref 0.0–0.2)

## 2021-07-17 LAB — URINALYSIS, ROUTINE W REFLEX MICROSCOPIC
Bacteria, UA: NONE SEEN
Bilirubin Urine: NEGATIVE
Glucose, UA: >=500 mg/dL — AB
Ketones, ur: 5 mg/dL — AB
Leukocytes,Ua: NEGATIVE
Nitrite: NEGATIVE
Protein, ur: NEGATIVE mg/dL
Specific Gravity, Urine: 1.023 (ref 1.005–1.030)
pH: 5 (ref 5.0–8.0)

## 2021-07-17 LAB — BASIC METABOLIC PANEL
Anion gap: 8 (ref 5–15)
BUN: 17 mg/dL (ref 8–23)
CO2: 25 mmol/L (ref 22–32)
Calcium: 9 mg/dL (ref 8.9–10.3)
Chloride: 105 mmol/L (ref 98–111)
Creatinine, Ser: 1.31 mg/dL — ABNORMAL HIGH (ref 0.61–1.24)
GFR, Estimated: 60 mL/min — ABNORMAL LOW (ref >60)
Glucose, Bld: 151 mg/dL — ABNORMAL HIGH (ref 70–99)
Potassium: 3.7 mmol/L (ref 3.5–5.1)
Sodium: 138 mmol/L (ref 135–145)

## 2021-07-17 MED ORDER — OXYCODONE-ACETAMINOPHEN 5-325 MG PO TABS: 1.0000 | ORAL_TABLET | ORAL | 0 refills | Status: DC | PRN

## 2021-07-17 MED ORDER — ONDANSETRON 4 MG PO TBDP: 4.0000 mg | ORAL_TABLET | Freq: Three times a day (TID) | ORAL | 0 refills | Status: DC | PRN

## 2021-07-17 MED ORDER — SODIUM CHLORIDE 0.9 % IV BOLUS
1000.0000 mL | Freq: Once | INTRAVENOUS | Status: AC
  Administered 2021-07-17: 1000 mL via INTRAVENOUS

## 2021-07-17 MED ORDER — ONDANSETRON HCL 4 MG/2ML IJ SOLN
4.0000 mg | Freq: Once | INTRAMUSCULAR | Status: AC
  Administered 2021-07-17: 4 mg via INTRAVENOUS
  Filled 2021-07-17: qty 2

## 2021-07-17 MED ORDER — NAPROXEN 500 MG PO TABS: 500.0000 mg | ORAL_TABLET | Freq: Two times a day (BID) | ORAL | 0 refills | Status: DC

## 2021-07-17 MED ORDER — KETOROLAC TROMETHAMINE 30 MG/ML IJ SOLN
30.0000 mg | Freq: Once | INTRAMUSCULAR | Status: AC
Start: 2021-07-17 — End: 2021-07-17
  Administered 2021-07-17: 30 mg via INTRAVENOUS
  Filled 2021-07-17: qty 1

## 2021-07-17 MED ORDER — TAMSULOSIN HCL 0.4 MG PO CAPS: 0.4000 mg | ORAL_CAPSULE | Freq: Two times a day (BID) | ORAL | 0 refills | Status: DC

## 2021-07-17 MED ORDER — HYDROMORPHONE HCL 1 MG/ML IJ SOLN
1.0000 mg | Freq: Once | INTRAMUSCULAR | Status: AC
  Administered 2021-07-17: 1 mg via INTRAVENOUS
  Filled 2021-07-17: qty 1

## 2021-07-17 MED ORDER — IOHEXOL 300 MG/ML  SOLN: 100.0000 mL | Freq: Once | INTRAMUSCULAR | Status: DC | PRN

## 2021-07-17 MED ORDER — HYDROCODONE-ACETAMINOPHEN 5-325 MG PO TABS: 1.0000 | ORAL_TABLET | Freq: Four times a day (QID) | ORAL | 0 refills | Status: DC | PRN

## 2021-07-17 NOTE — ED Notes (Signed)
EDP in room at time of triage.

## 2021-07-17 NOTE — ED Provider Notes (Signed)
[facility] EMERGENCY DEPARTMENT Provider Note   CSN: [id number] Arrival date & time: 07/17/21  1754     History  Chief Complaint  Patient presents with   Flank Pain    Carl Jimenez is a 66 y.o. male.   Flank Pain Associated symptoms include abdominal pain. Pertinent negatives include no chest pain and no shortness of breath.      Carl Jimenez is a 66 y.o. male who presents to the Emergency Department complaining of left flank pain gradually worsening for several days.  Pain has increased in severity today and now associated with nausea.  Pain radiates into his abdomen.  He also notes having some dark orange-colored urine for several days.  He initially attributed this to the liquids that he was drinking.  History of kidney stones and current symptoms feel similar.  Denies any fever, chills, vomiting.  No chest pain or shortness of breath.  Pain does not radiate to his groin or penis.  Home Medications Prior to Admission medications   Medication Sig Start Date End Date Taking? Authorizing Provider  albuterol (VENTOLIN HFA) 108 (90 Base) MCG/ACT inhaler Inhale into the lungs. 05/20/21   [provider]  Bismuth 262 MG CHEW Chew 2 each by mouth every 6 (six) hours. 06/29/21   Dolores Frame, MD  citalopram (CELEXA) 20 MG tablet Take 20 mg by mouth daily. 04/30/21   [provider]  clobetasol cream (TEMOVATE) 0.05 % Apply topically. 03/09/21   [provider]  famotidine (PEPCID) 10 MG tablet Take 10 mg by mouth 2 (two) times daily.    [provider]  melatonin 3 MG TABS tablet Take 3 mg by mouth at bedtime.    [provider]  metFORMIN (GLUCOPHAGE-XR) 500 MG 24 hr tablet Take 500 mg by mouth 2 (two) times daily. 03/09/21   [provider]  Multiple Vitamin (MULTIVITAMIN) tablet Take 1 tablet by mouth daily.    [provider]  omeprazole (PRILOSEC) 40 MG capsule Take 1 capsule (40 mg total) by mouth in the  morning and at bedtime. 06/15/21   Dolores Frame, MD  rosuvastatin (CRESTOR) 20 MG tablet Take 20 mg by mouth at bedtime. 04/30/21   [provider]      Allergies    Patient has no allergy information on record.    Review of Systems   Review of Systems  Respiratory:  Negative for shortness of breath.   Cardiovascular:  Negative for chest pain.  Gastrointestinal:  Positive for abdominal pain and nausea. Negative for vomiting.  Genitourinary:  Positive for dysuria and flank pain. Negative for penile pain, penile swelling and scrotal swelling.       Orange-colored urine  All other systems reviewed and are negative.  Physical Exam Updated Vital Signs BP (!) 192/91    Pulse 64    Temp 97.7 F (36.5 C)    Resp 17    Ht 6' (1.829 m)    Wt 90.7 kg    SpO2 100%    BMI 27.12 kg/m  Physical Exam Vitals and nursing note reviewed.  Constitutional:      General: He is not in acute distress.    Appearance: He is not diaphoretic.     Comments: Patient standing leaning over the stretcher.  Uncomfortable appearing.    HENT:     Mouth/Throat:     Mouth: Mucous membranes are moist.  Cardiovascular:     Rate and Rhythm: Normal rate and regular rhythm.  Pulses: Normal pulses.  Pulmonary:     Effort: Pulmonary effort is normal.     Breath sounds: Normal breath sounds.  Abdominal:     General: There is no distension.     Palpations: Abdomen is soft.     Tenderness: There is no abdominal tenderness. There is no right CVA tenderness or left CVA tenderness.  Musculoskeletal:        General: Normal range of motion.     Right lower leg: No edema.     Left lower leg: No edema.  Skin:    General: Skin is warm.     Capillary Refill: Capillary refill takes less than 2 seconds.  Neurological:     General: No focal deficit present.     Mental Status: He is alert.     Sensory: No sensory deficit.     Motor: No weakness.    ED Results / Procedures / Treatments   Labs (all  labs ordered are listed, but only abnormal results are displayed) Labs Reviewed  CBC WITH DIFFERENTIAL/PLATELET - Abnormal; Notable for the following components:      Result Value   WBC 10.9 (*)    All other components within normal limits  BASIC METABOLIC PANEL - Abnormal; Notable for the following components:   Glucose, Bld 151 (*)    Creatinine, Ser 1.31 (*)    GFR, Estimated 60 (*)    All other components within normal limits  URINALYSIS, ROUTINE W REFLEX MICROSCOPIC    EKG None  Radiology CT Renal Stone Study  Result Date: 07/17/2021 CLINICAL DATA:  Left flank pain starting yesterday.  Nausea. EXAM: CT ABDOMEN AND PELVIS WITHOUT CONTRAST TECHNIQUE: Multidetector CT imaging of the abdomen and pelvis was performed following the standard protocol without IV contrast. RADIATION DOSE REDUCTION: This exam was performed according to the departmental dose-optimization program which includes automated exposure control, adjustment of the mA and/or kV according to patient size and/or use of iterative reconstruction technique. COMPARISON:  Report from CT scan dated 11/16/2014 FINDINGS: Lower chest: Mild descending thoracic aortic atherosclerotic calcification. Hepatobiliary: 0.8 by 0.6 cm hypodense lesion in segment 3 of the liver on image 22 series 2, nonspecific. Gallbladder unremarkable. No biliary dilatation. Pancreas: Unremarkable Spleen: Unremarkable Adrenals/Urinary Tract: Both adrenal glands appear normal. Mild left hydronephrosis mild left hydroureter extending down to a 2 mm in length left distal ureteral calculus located about 9 mm proximal to the UVJ shown on image 82 series 6. Left mid kidney hypodense lesion measuring 2.1 cm craniocaudad, fluid density, appearance favors a cyst. 1.3 by 1.1 cm hypodense right mid kidney lesion has faint calcification along its superior and inferior margin on image 63 series 5, and is most likely a small complex cyst although enhancement characteristics are  not interrogated. Stomach/Bowel: Descending and sigmoid colon diverticulosis. Prominent stool throughout the colon favors constipation. Normal appendix. Vascular/Lymphatic: Fusiform infrarenal abdominal aortic aneurysm 3.0 cm in transverse diameter on image 39 series 2. Atherosclerosis is present, including aortoiliac atherosclerotic disease. Reproductive: Dystrophic calcifications in the prostate gland noted. Other: No supplemental non-categorized findings. Musculoskeletal: Anterior spurring of the sacroiliac joints. Mild lumbar spondylosis and degenerative disc disease. Possible central narrowing of the thecal sac at L4-5. IMPRESSION: 1. 2 mm left distal ureteral calculus associated with left hydronephrosis and left hydroureter. This calculus is located about 9 mm proximal to the left UVJ. No other urinary tract calculi identified. 2. Faint rim calcification along a right mid kidney lesion which is probably a minimally complex cyst.  Fluid density left mid kidney lesion is likewise probably a cyst. 3.  Prominent stool throughout the colon favors constipation. 4. Descending and sigmoid colon diverticulosis. 5. Infrarenal abdominal aortic aneurysm 3.0 cm in diameter. Recommend follow-up ultrasound every 3 years. This recommendation follows ACR consensus guidelines: White Paper of the ACR Incidental Findings Committee II on Vascular Findings. J Am Coll Radiol 2013; 10:789-794. 6. 7 mm hypodense lesion in the liver is nonspecific but statistically likely to be benign, assuming the patient does not have a history of gastrointestinal malignancy. 7.  Aortic Atherosclerosis (ICD10-I70.0). 8. Potential central narrowing of the thecal sac at L4-5 due to spondylosis and degenerative disc disease. Electronically Signed   By: Gaylyn RongWalter  Liebkemann M.D.   On: 07/17/2021 19:01    Procedures Procedures    Medications Ordered in ED Medications  HYDROmorphone (DILAUDID) injection 1 mg (has no administration in time range)   ondansetron (ZOFRAN) injection 4 mg (has no administration in time range)    ED Course/ Medical Decision Making/ A&P                           Medical Decision Making Amount and/or Complexity of Data Reviewed Labs: ordered. Radiology: ordered.  Risk Prescription drug management.   This patient presents to the ED for concern of left flank pain, this involves an extensive number of treatment options, and is a complaint that carries with it a high risk of complications and morbidity.  The differential diagnosis includes kidney stone, pyelonephritis, urinary tract infection, musculoskeletal injury     Additional history obtained:  Additional history obtained from prior medical records    Lab Tests:  I Ordered, and personally interpreted labs.  The pertinent results include: No significant leukocytosis, serum creatinine elevated at 1.31, within normal limits 1 month ago.  Imaging Studies ordered:  I ordered imaging studies including CT renal stone study I independently visualized and interpreted imaging which showed left-sided ureteral calculi with hydronephrosis.  Stone appears proximal to the left UVJ I agree with the radiologist interpretation     Medicines ordered and prescription drug management:  I ordered medication including pain medication for flank pain Reevaluation of the patient after these medicines showed that the patient's greatly improved    Reevaluation:  After the interventions noted above, I reevaluated the patient and found that they have : Pain is improved after hydromorphone  Patient with left-sided ureteral stone, likely able to pass.  Elevated serum creatinine we will give fluids here.  Discussed findings with Dr. Hyacinth MeekerMiller who assumes care of patient at end of shift.  He will likely be discharged home if pain is controlled and follow-up closely with urology.          Final Clinical Impression(s) / ED Diagnoses Final diagnoses:   Ureterolithiasis    Rx / DC Orders ED Discharge Orders     None         Rosey Bathriplett, [place], PA-C 07/17/21 1917    Eber HongMiller, Brian, MD 07/17/21 [phone]

## 2021-07-17 NOTE — Discharge Instructions (Signed)
Your exam and or your xrays have shown that you likely have a kidney stone.  You should follow up with the Urologist of your choosing or the Urologist listed above in the next 2-3 days if you have not passed the stone.  You should urinate in to the strainer until you pass the stone.    Flomax helps with passing the stone by opening up the Ureters (tubes), Vicodin and an antiinflammatory for pain if you are not allergic to these medicines.  Phenergan or Zofran for nausea.  Return to the ER for severe or worsening pain, vomiting or fevers or if you are unable to control your pain with the medicines provided.  Kidney Stones Kidney stones (ureteral lithiasis) are deposits that form inside your kidneys. The intense pain is caused by the stone moving through the urinary tract. When the stone moves, the ureter goes into spasm around the stone. The stone is usually passed in the urine.  CAUSES  A disorder that makes certain neck glands produce too much parathyroid hormone (primary hyperparathyroidism).  A buildup of uric acid crystals.  Narrowing (stricture) of the ureter.  A kidney obstruction present at birth (congenital obstruction).  Previous surgery on the kidney or ureters.  Numerous kidney infections.  SYMPTOMS  Feeling sick to your stomach (nauseous).  Throwing up (vomiting).  Blood in the urine (hematuria).  Pain that usually spreads (radiates) to the groin.  Frequency or urgency of urination.  DIAGNOSIS  Taking a history and physical exam.  Blood or urine tests.  Computerized X-ray scan (CT scan).  Occasionally, an examination of the inside of the urinary bladder (cystoscopy) is performed.  TREATMENT  Observation.  Increasing your fluid intake.  Surgery may be needed if you have severe pain or persistent obstruction.  The size, location, and chemical composition are all important variables that will determine the proper choice of action for you. Talk to your caregiver to better  understand your situation so that you will minimize the risk of injury to yourself and your kidney.  HOME CARE INSTRUCTIONS  Drink enough water and fluids to keep your urine clear or pale yellow.  Strain all urine through the provided strainer. Keep all particulate matter and stones for your caregiver to see. The stone causing the pain may be as small as a grain of salt. It is very important to use the strainer each and every time you pass your urine. The collection of your stone will allow your caregiver to analyze it and verify that a stone has actually passed.  Only take over-the-counter or prescription medicines for pain, discomfort, or fever as directed by your caregiver.  Make a follow-up appointment with your caregiver as directed.  Get follow-up X-rays if required. The absence of pain does not always mean that the stone has passed. It may have only stopped moving. If the urine remains completely obstructed, it can cause loss of kidney function or even complete destruction of the kidney. It is your responsibility to make sure X-rays and follow-ups are completed. Ultrasounds of the kidney can show blockages and the status of the kidney. Ultrasounds are not associated with any radiation and can be performed easily in a matter of minutes.  SEEK IMMEDIATE MEDICAL CARE IF:  Pain cannot be controlled with the prescribed medicine.  You have a fever.  The severity or intensity of pain increases over 18 hours and is not relieved by pain medicine.  You develop a new onset of abdominal pain.  You   feel faint or pass out.  MAKE SURE YOU:  Understand these instructions.  Will watch your condition.  Will get help right away if you are not doing well or get worse.  Document Released: 06/12/2005 Document Revised: 06/01/2011 Document Reviewed: 10/08/2009 [employer] Patient Information 2012 [employer].    

## 2021-07-17 NOTE — ED Triage Notes (Signed)
Patient reports left flank pain since yesterday with nausea. States that he has hx of kidney stones and pain is same. Worse in last four hours. Pain radiates to abdomen.

## 2021-07-17 NOTE — ED Notes (Signed)
Patient transported to CT 

## 2021-07-18 MED FILL — Hydrocodone-Acetaminophen Tab 5-325 MG: ORAL | Qty: 6 | Status: AC

## 2021-07-21 ENCOUNTER — Encounter (INDEPENDENT_AMBULATORY_CARE_PROVIDER_SITE_OTHER): Payer: Self-pay | Admitting: Gastroenterology

## 2021-07-21 ENCOUNTER — Other Ambulatory Visit: Payer: Self-pay

## 2021-07-21 ENCOUNTER — Ambulatory Visit (INDEPENDENT_AMBULATORY_CARE_PROVIDER_SITE_OTHER): Payer: Medicare (Managed Care) | Admitting: Gastroenterology

## 2021-07-21 ENCOUNTER — Encounter (INDEPENDENT_AMBULATORY_CARE_PROVIDER_SITE_OTHER): Payer: Self-pay

## 2021-07-21 ENCOUNTER — Other Ambulatory Visit (INDEPENDENT_AMBULATORY_CARE_PROVIDER_SITE_OTHER): Payer: Self-pay

## 2021-07-21 VITALS — BP 165/77 | HR 66 | Temp 97.3°F | Ht 72.0 in | Wt 200.2 lb

## 2021-07-21 DIAGNOSIS — B9681 Helicobacter pylori [H. pylori] as the cause of diseases classified elsewhere: Secondary | ICD-10-CM

## 2021-07-21 DIAGNOSIS — R1310 Dysphagia, unspecified: Secondary | ICD-10-CM

## 2021-07-21 DIAGNOSIS — H25812 Combined forms of age-related cataract, left eye: Secondary | ICD-10-CM | POA: Diagnosis not present

## 2021-07-21 DIAGNOSIS — K2970 Gastritis, unspecified, without bleeding: Secondary | ICD-10-CM

## 2021-07-21 MED ORDER — BISMUTH 262 MG PO CHEW: 2.0000 | CHEWABLE_TABLET | Freq: Four times a day (QID) | ORAL | 0 refills | Status: DC

## 2021-07-21 NOTE — Patient Instructions (Signed)
Please finish up course of tetracycline, flagyl and pick up bismuth chews at your pharmacy to complete H pylori treatment. Continue your omeprazole 40mg  twice a day. Continue to chew thoroughly, take small bites and avoid thicker meats until repeat EGD  We will get you scheduled for repeat upper endoscopy in mid to late February to re evaluate previous findings of esophagitis and H pylori. Please let me know if you have any new or worsening symptoms. Please avoid NSAIDs (advil, aleve, naproxen, goody powder, ibuprofen) as these can be very hard on your GI tract, causing inflammation, ulcers and damage to the lining of your GI tract.   Follow up 3 months

## 2021-07-21 NOTE — Progress Notes (Signed)
Referring Provider: Donetta Potts, MD Primary Care Physician:  Donetta Potts, MD Primary GI Physician: Levon Hedger  Chief Complaint  Patient presents with   Dysphagia    Patient here today due to having issues with dysphagia. He says symptoms on going for years. He has occasional abdominal pain, he states he feels nauseated all the time, Has diarrhea alternating with constipation. Hx of h pylori on Metronidazole and tetracycline.   HPI:   Carl Jimenez is a 66 y.o. male with past medical history of DM, HTN, high cholesterol, psoriasis, COPD, depression, hx of H pylori.   Patient presenting today as new patient for ongoing dysphagia, abdominal pain, nausea, diarrhea and constipation.   Patient with EGD in December 2022 after he presented to ED with food impaction, with presence of H pylori infection on biopsy, treated with quadruple bismuth therapy with recommendations to repeat EGD in February, also started on Omeprazole 40mg  BID x3 months.   Today, Patient states that he is feeling better since starting H pylori treatment about 1 week ago. He states, however,  that he does not think he got the bismuth chews, only the PPI, tetracycline and flagyl. Reports dysphagia has resolved. Reports acid reflux medication has helped his reflux, he is still maintained on Omeprazole 40mg  BID. He is having no abdominal pain, early satiety, nausea, vomiting or diarrhea.   He does report that he has some alternations between constipation and diarrhea since starting metformin, he has about 2 BMs per day. States appetite is good, denies early satiety. States he has changed his diet due to DM, states he has cut out a lot of carbs and starches. Weight is stable, as is appetite.   Recent labs sent by PCP WNL, notably patient with hypothyroidism with TSH of 6.15  NSAID use: very rare Social hx:no etoh or tobacco  Fam hx:no CRC or liver disease  Last Colonoscopy:>10 years, patient does not want to have  another colonoscopy Last Endoscopy:06/15/21- Food in the lower third of the esophagus. Removal was successful. - Moderately severe esophagitis with no bleeding.( Reactive squamous mucosa with increasted epithelial lymphocytes and rare eosinophils) - Erythematous mucosa in the stomach. (H pylori) (treated with quadruple bismuth regimen)   Past Medical History:  Diagnosis Date   Diabetes mellitus without complication (HCC)    Hypertension     Past Surgical History:  Procedure Laterality Date   BIOPSY  06/15/2021   Procedure: BIOPSY;  Surgeon: 06/17/21, 06/17/2021, MD;  Location: [facility];  Service: Gastroenterology;;  gastric, distal, esophageal, mid-esophageal   ESOPHAGOGASTRODUODENOSCOPY (EGD) WITH PROPOFOL N/A 06/15/2021   Procedure: ESOPHAGOGASTRODUODENOSCOPY (EGD) WITH PROPOFOL;  Surgeon: Reuel Boom, MD;  Location: [facility];  Service: Gastroenterology;  Laterality: N/A;   HERNIA REPAIR      Current Outpatient Medications  Medication Sig Dispense Refill   albuterol (VENTOLIN HFA) 108 (90 Base) MCG/ACT inhaler Inhale into the lungs.     citalopram (CELEXA) 20 MG tablet Take 20 mg by mouth daily.     clobetasol cream (TEMOVATE) 0.05 % Apply topically.     empagliflozin (JARDIANCE) 10 MG TABS tablet Take 10 mg by mouth daily.     metFORMIN (GLUCOPHAGE-XR) 500 MG 24 hr tablet Take 500 mg by mouth 2 (two) times daily.     metroNIDAZOLE (FLAGYL) 500 MG tablet Take 500 mg by mouth 3 (three) times daily.     Multiple Vitamin (MULTIVITAMIN) tablet Take 1 tablet by mouth daily.     naproxen (  NAPROSYN) 500 MG tablet Take 1 tablet (500 mg total) by mouth 2 (two) times daily with a meal. 30 tablet 0   omeprazole (PRILOSEC) 40 MG capsule Take 1 capsule (40 mg total) by mouth in the morning and at bedtime. 180 capsule 1   ondansetron (ZOFRAN-ODT) 4 MG disintegrating tablet Take 1 tablet (4 mg total) by mouth every 8 (eight) hours as needed for nausea. 10 tablet 0    rosuvastatin (CRESTOR) 20 MG tablet Take 20 mg by mouth at bedtime.     tamsulosin (FLOMAX) 0.4 MG CAPS capsule Take 1 capsule (0.4 mg total) by mouth 2 (two) times daily. 10 capsule 0   tetracycline (SUMYCIN) 500 MG capsule Take 500 mg by mouth 4 (four) times daily.     Bismuth 262 MG CHEW Chew 2 each by mouth every 6 (six) hours. (Patient not taking: Reported on 07/21/2021) 112 tablet 0   No current facility-administered medications for this visit.    Allergies as of 07/21/2021   (No Known Allergies)    History reviewed. No pertinent family history.  Social History   Socioeconomic History   Marital status: Single    Spouse name: Not on file   Number of children: Not on file   Years of education: Not on file   Highest education level: Not on file  Occupational History   Not on file  Tobacco Use   Smoking status: Never   Smokeless tobacco: Never  Vaping Use   Vaping Use: Never used  Substance and Sexual Activity   Alcohol use: Never   Drug use: Never   Sexual activity: Yes  Other Topics Concern   Not on file  Social History Narrative   Not on file   Social Determinants of Health   Financial Resource Strain: Not on file  Food Insecurity: Not on file  Transportation Needs: Not on file  Physical Activity: Not on file  Stress: Not on file  Social Connections: Not on file   Review of systems General: negative for malaise, night sweats, fever, chills, weight loss Neck: Negative for lumps, goiter, pain and significant neck swelling Resp: Negative for cough, wheezing, dyspnea at rest CV: Negative for chest pain, leg swelling, palpitations, orthopnea GI: denies melena, hematochezia, nausea, vomiting, dysphagia, odyonophagia, early satiety or unintentional weight loss. +diarrhea +constipation MSK: Negative for joint pain or swelling, back pain, and muscle pain. Derm: Negative for itching or rash Psych: Denies depression, anxiety, memory loss, confusion. No homicidal or  suicidal ideation.  Heme: Negative for prolonged bleeding, bruising easily, and swollen nodes. Endocrine: Negative for cold or heat intolerance, polyuria, polydipsia and goiter. Neuro: negative for tremor, gait imbalance, syncope and seizures. The remainder of the review of systems is noncontributory.  Physical Exam: BP (!) 165/77 (BP Location: Left Arm, Patient Position: Sitting, Cuff Size: Large)    Pulse 66    Temp (!) 97.3 F (36.3 C) (Oral)    Ht 6' (1.829 m)    Wt 200 lb 3.2 oz (90.8 kg)    BMI 27.15 kg/m  General:   Alert and oriented. No distress noted. Pleasant and cooperative.  Head:  Normocephalic and atraumatic. Eyes:  Conjuctiva clear without scleral icterus. Mouth:  Oral mucosa pink and moist. Good dentition. No lesions. Heart: Normal rate and rhythm, s1 and s2 heart sounds present.  Lungs: Clear lung sounds in all lobes. Respirations equal and unlabored. Abdomen:  +BS, soft, non-tender and non-distended. No rebound or guarding. No HSM or masses noted. Derm:  No palmar erythema or jaundice °Msk:  Symmetrical without gross deformities. Normal posture. °Extremities:  Without edema. °Neurologic:  Alert and  oriented x4 °Psych:  Alert and cooperative. Normal mood and affect. ° °Invalid input(s): 6 MONTHS  ° °ASSESSMENT: °Carl Jimenez is a 65 y.o. male presenting today for follow up of dysphagia, nausea, abdominal pain, diarrhea and constipation. ° °Dysphagia has resolved since starting quadruple therapy for Presence of H pylori infection found on EGD in Dec 2022, though he does not think he got the bismuth, this was sent to pharmacy today and patient advised to continue Omeprazole 40mg BID, tetracycline, flagyl and take bismuth chews,two 6H x14 days. He is without nausea, vomiting, abdominal pain. Is able to eat most anything now, but continues to avoid pork as this is what he got choked on in December. He should continue to avoid thicker meats/take small bites, and chew thoroughly to  avoid further food impaction until repeat EGD. Will set patient up for repeat EGD in Mid February for re evaluation of possible stricture, previously noted esophagitis and eradication of H pylori.  ° °Has a mix of Constipation and diarrhea though he feels that this is mostly related to his metformin,  he states that he has 2 BMs per day usually, without rectal bleeding, abdominal pain, weight loss or appetite changes. His last colonoscopy was more than 10 years ago, patient states he has no family hx of CRC or personal history of polyps, he does not wish to proceed with any further screening colonoscopies. I did discuss the indications for screening colonoscopies every 10 years and the importance of this in relation to the detection of precancerous polyps and CRC. Patient still does not wish to update screening colonoscopy at this time, he will make me aware if he changes his mind.  ° ° °PLAN:  °Continue Omeprazole 40mg BID °2. Repeat EGD mid february °3. Start Bismuth as part of quadruple H pylori treatment, Rx resent °4. Continue tetracycline and flagyl until finished °5. Continue to avoid thicker meats, chewing precautions with small bites and chewing thoroughly.  °6. Continue to avoid NSAIDs ° °Follow Up: °3 months ° °Carl Belardo L. Seaton Hofmann, MSN, APRN, AGNP-C °Adult-Gerontology Nurse Practitioner °[facility] ° °

## 2021-07-21 NOTE — H&P (View-Only) (Signed)
Referring Provider: Donetta Potts, MD Primary Care Physician:  Donetta Potts, MD Primary GI Physician: Levon Hedger  Chief Complaint  Patient presents with   Dysphagia    Patient here today due to having issues with dysphagia. He says symptoms on going for years. He has occasional abdominal pain, he states he feels nauseated all the time, Has diarrhea alternating with constipation. Hx of h pylori on Metronidazole and tetracycline.   HPI:   Carl Jimenez is a 66 y.o. male with past medical history of DM, HTN, high cholesterol, psoriasis, COPD, depression, hx of H pylori.   Patient presenting today as new patient for ongoing dysphagia, abdominal pain, nausea, diarrhea and constipation.   Patient with EGD in December 2022 after he presented to ED with food impaction, with presence of H pylori infection on biopsy, treated with quadruple bismuth therapy with recommendations to repeat EGD in February, also started on Omeprazole 40mg  BID x3 months.   Today, Patient states that he is feeling better since starting H pylori treatment about 1 week ago. He states, however,  that he does not think he got the bismuth chews, only the PPI, tetracycline and flagyl. Reports dysphagia has resolved. Reports acid reflux medication has helped his reflux, he is still maintained on Omeprazole 40mg  BID. He is having no abdominal pain, early satiety, nausea, vomiting or diarrhea.   He does report that he has some alternations between constipation and diarrhea since starting metformin, he has about 2 BMs per day. States appetite is good, denies early satiety. States he has changed his diet due to DM, states he has cut out a lot of carbs and starches. Weight is stable, as is appetite.   Recent labs sent by PCP WNL, notably patient with hypothyroidism with TSH of 6.15  NSAID use: very rare Social hx:no etoh or tobacco  Fam hx:no CRC or liver disease  Last Colonoscopy:>10 years, patient does not want to have  another colonoscopy Last Endoscopy:06/15/21- Food in the lower third of the esophagus. Removal was successful. - Moderately severe esophagitis with no bleeding.( Reactive squamous mucosa with increasted epithelial lymphocytes and rare eosinophils) - Erythematous mucosa in the stomach. (H pylori) (treated with quadruple bismuth regimen)   Past Medical History:  Diagnosis Date   Diabetes mellitus without complication (HCC)    Hypertension     Past Surgical History:  Procedure Laterality Date   BIOPSY  06/15/2021   Procedure: BIOPSY;  Surgeon: 06/17/21, 06/17/2021, MD;  Location: [facility];  Service: Gastroenterology;;  gastric, distal, esophageal, mid-esophageal   ESOPHAGOGASTRODUODENOSCOPY (EGD) WITH PROPOFOL N/A 06/15/2021   Procedure: ESOPHAGOGASTRODUODENOSCOPY (EGD) WITH PROPOFOL;  Surgeon: Reuel Boom, MD;  Location: [facility];  Service: Gastroenterology;  Laterality: N/A;   HERNIA REPAIR      Current Outpatient Medications  Medication Sig Dispense Refill   albuterol (VENTOLIN HFA) 108 (90 Base) MCG/ACT inhaler Inhale into the lungs.     citalopram (CELEXA) 20 MG tablet Take 20 mg by mouth daily.     clobetasol cream (TEMOVATE) 0.05 % Apply topically.     empagliflozin (JARDIANCE) 10 MG TABS tablet Take 10 mg by mouth daily.     metFORMIN (GLUCOPHAGE-XR) 500 MG 24 hr tablet Take 500 mg by mouth 2 (two) times daily.     metroNIDAZOLE (FLAGYL) 500 MG tablet Take 500 mg by mouth 3 (three) times daily.     Multiple Vitamin (MULTIVITAMIN) tablet Take 1 tablet by mouth daily.     naproxen (  NAPROSYN) 500 MG tablet Take 1 tablet (500 mg total) by mouth 2 (two) times daily with a meal. 30 tablet 0   omeprazole (PRILOSEC) 40 MG capsule Take 1 capsule (40 mg total) by mouth in the morning and at bedtime. 180 capsule 1   ondansetron (ZOFRAN-ODT) 4 MG disintegrating tablet Take 1 tablet (4 mg total) by mouth every 8 (eight) hours as needed for nausea. 10 tablet 0    rosuvastatin (CRESTOR) 20 MG tablet Take 20 mg by mouth at bedtime.     tamsulosin (FLOMAX) 0.4 MG CAPS capsule Take 1 capsule (0.4 mg total) by mouth 2 (two) times daily. 10 capsule 0   tetracycline (SUMYCIN) 500 MG capsule Take 500 mg by mouth 4 (four) times daily.     Bismuth 262 MG CHEW Chew 2 each by mouth every 6 (six) hours. (Patient not taking: Reported on 07/21/2021) 112 tablet 0   No current facility-administered medications for this visit.    Allergies as of 07/21/2021   (No Known Allergies)    History reviewed. No pertinent family history.  Social History   Socioeconomic History   Marital status: Single    Spouse name: Not on file   Number of children: Not on file   Years of education: Not on file   Highest education level: Not on file  Occupational History   Not on file  Tobacco Use   Smoking status: Never   Smokeless tobacco: Never  Vaping Use   Vaping Use: Never used  Substance and Sexual Activity   Alcohol use: Never   Drug use: Never   Sexual activity: Yes  Other Topics Concern   Not on file  Social History Narrative   Not on file   Social Determinants of Health   Financial Resource Strain: Not on file  Food Insecurity: Not on file  Transportation Needs: Not on file  Physical Activity: Not on file  Stress: Not on file  Social Connections: Not on file   Review of systems General: negative for malaise, night sweats, fever, chills, weight loss Neck: Negative for lumps, goiter, pain and significant neck swelling Resp: Negative for cough, wheezing, dyspnea at rest CV: Negative for chest pain, leg swelling, palpitations, orthopnea GI: denies melena, hematochezia, nausea, vomiting, dysphagia, odyonophagia, early satiety or unintentional weight loss. +diarrhea +constipation MSK: Negative for joint pain or swelling, back pain, and muscle pain. Derm: Negative for itching or rash Psych: Denies depression, anxiety, memory loss, confusion. No homicidal or  suicidal ideation.  Heme: Negative for prolonged bleeding, bruising easily, and swollen nodes. Endocrine: Negative for cold or heat intolerance, polyuria, polydipsia and goiter. Neuro: negative for tremor, gait imbalance, syncope and seizures. The remainder of the review of systems is noncontributory.  Physical Exam: BP (!) 165/77 (BP Location: Left Arm, Patient Position: Sitting, Cuff Size: Large)    Pulse 66    Temp (!) 97.3 F (36.3 C) (Oral)    Ht 6' (1.829 m)    Wt 200 lb 3.2 oz (90.8 kg)    BMI 27.15 kg/m  General:   Alert and oriented. No distress noted. Pleasant and cooperative.  Head:  Normocephalic and atraumatic. Eyes:  Conjuctiva clear without scleral icterus. Mouth:  Oral mucosa pink and moist. Good dentition. No lesions. Heart: Normal rate and rhythm, s1 and s2 heart sounds present.  Lungs: Clear lung sounds in all lobes. Respirations equal and unlabored. Abdomen:  +BS, soft, non-tender and non-distended. No rebound or guarding. No HSM or masses noted. Derm:  No palmar erythema or jaundice Msk:  Symmetrical without gross deformities. Normal posture. Extremities:  Without edema. Neurologic:  Alert and  oriented x4 Psych:  Alert and cooperative. Normal mood and affect.  Invalid input(s): 6 MONTHS   ASSESSMENT: Carl Jimenez is a 66 y.o. male presenting today for follow up of dysphagia, nausea, abdominal pain, diarrhea and constipation.  Dysphagia has resolved since starting quadruple therapy for Presence of H pylori infection found on EGD in Dec 2022, though he does not think he got the bismuth, this was sent to pharmacy today and patient advised to continue Omeprazole 40mg  BID, tetracycline, flagyl and take bismuth chews,two 6H x14 days. He is without nausea, vomiting, abdominal pain. Is able to eat most anything now, but continues to avoid pork as this is what he got choked on in December. He should continue to avoid thicker meats/take small bites, and chew thoroughly to  avoid further food impaction until repeat EGD. Will set patient up for repeat EGD in Mid February for re evaluation of possible stricture, previously noted esophagitis and eradication of H pylori.   Has a mix of Constipation and diarrhea though he feels that this is mostly related to his metformin,  he states that he has 2 BMs per day usually, without rectal bleeding, abdominal pain, weight loss or appetite changes. His last colonoscopy was more than 10 years ago, patient states he has no family hx of CRC or personal history of polyps, he does not wish to proceed with any further screening colonoscopies. I did discuss the indications for screening colonoscopies every 10 years and the importance of this in relation to the detection of precancerous polyps and CRC. Patient still does not wish to update screening colonoscopy at this time, he will make me aware if he changes his mind.    PLAN:  Continue Omeprazole 40mg  BID 2. Repeat EGD mid february 3. Start Bismuth as part of quadruple H pylori treatment, Rx resent 4. Continue tetracycline and flagyl until finished 5. Continue to avoid thicker meats, chewing precautions with small bites and chewing thoroughly.  6. Continue to avoid NSAIDs  Follow Up: 3 months  Keosha Rossa L. Jeanmarie Hubertarlan, MSN, APRN, AGNP-C Adult-Gerontology Nurse Practitioner [facility]

## 2021-07-22 ENCOUNTER — Encounter (INDEPENDENT_AMBULATORY_CARE_PROVIDER_SITE_OTHER): Payer: Self-pay

## 2021-07-23 DIAGNOSIS — R1310 Dysphagia, unspecified: Secondary | ICD-10-CM | POA: Insufficient documentation

## 2021-07-23 DIAGNOSIS — B9681 Helicobacter pylori [H. pylori] as the cause of diseases classified elsewhere: Secondary | ICD-10-CM | POA: Insufficient documentation

## 2021-07-23 DIAGNOSIS — K2970 Gastritis, unspecified, without bleeding: Secondary | ICD-10-CM | POA: Insufficient documentation

## 2021-07-25 ENCOUNTER — Other Ambulatory Visit: Payer: Self-pay

## 2021-07-25 ENCOUNTER — Encounter (HOSPITAL_COMMUNITY): Payer: Self-pay

## 2021-07-25 ENCOUNTER — Encounter (HOSPITAL_COMMUNITY)
Admission: RE | Admit: 2021-07-25 | Discharge: 2021-07-25 | Disposition: A | Discharge status: Home / Self Care | Payer: Medicare Other | Source: Ambulatory Visit | Attending: Ophthalmology | Admitting: Ophthalmology

## 2021-07-25 NOTE — Patient Instructions (Signed)
° °   Carl Jimenez  07/25/2021     @PREFPERIOPPHARMACY @   Your procedure is scheduled on  07-29-2021.   Report to [facility] at  0930 A.M.   Call this number if you have problems the morning of surgery:  [phone]   Remember:  Do not eat or drink after midnight.      Take these medicines the morning of surgery with A SIP OF WATER                                            Celexa, Pepcid, omeprazole, flomax.     Do not wear jewelry, make-up or nail polish.  Do not wear lotions, powders, or perfumes, or deodorant.  Do not shave 48 hours prior to surgery.  Men may shave face and neck.  Do not bring valuables to the hospital.  [facility] is not responsible for any belongings or valuables.  Contacts, dentures or bridgework may not be worn into surgery.  Leave your suitcase in the car.  After surgery it may be brought to your room.  For patients admitted to the hospital, discharge time will be determined by your treatment team.  Patients discharged the day of surgery will not be allowed to drive home and must have someone with them for 24 hours.    Special instructions:   DO NOT smoke tobacco or vape for 24 hours.  Please read over the following fact sheets that you were given. Anesthesia Post-op Instructions and Care and Recovery After Surgery

## 2021-07-26 NOTE — H&P (Signed)
Surgical History & Physical  Patient Name: Carl Jimenez DOB: 11/12/1955  Surgery: Cataract extraction with intraocular lens implant phacoemulsification; Left Eye  Surgeon: Carl Pierce MD Surgery Date:  07-29-21 Pre-Op Date:  07-25-21  HPI: A 20 Yr. old male patient Pt referred by Dr. Daphine Jimenez for cataract evaluation. The patient complains of nighttime light - car headlights, street lamps etc. glare causing poor vision, which began 3 years ago. Both eyes are affected. OS>OD. The episode is gradual. The condition's severity is worsening. The complaint is associated with blurry vision, increased tearing and glare. Pt has d/c night driving due to symptoms. This is This is negatively affecting the patient's quality of life and the patient is unable to function adequately in life with the current level of vision. Pt using AT's prn OU. Pt denies any eye pain or increase in floaters/flashes of light. HPI was performed by Carl Jimenez .  Medical History: Cataracts Macula Degeneration Glaucoma Arthritis Diabetes - DM Type 2 GERD Anxiety/Depression LDL Lung Problems  Review of Systems Negative Allergic/Immunologic Negative Cardiovascular Negative Constitutional Negative Ear, Nose, Mouth & Throat Negative Endocrine Negative Eyes Negative Gastrointestinal Negative Genitourinary Negative Hemotologic/Lymphatic Negative Integumentary Negative Musculoskeletal Negative Neurological Negative Psychiatry Negative Respiratory  Social   Former smoker   Medication Artificial Tears, Albuterol, Citalopram, Metformin, Rosuvastatin,   Sx/Procedures Hernia Sx,   Drug Allergies   NKDA  History & Physical: Heent: Cataract, Left Eye NECK: supple without bruits LUNGS: lungs clear to auscultation CV: regular rate and rhythm Abdomen: soft and non-tender  Impression & Plan: Assessment: 1.  COMBINED FORMS AGE RELATED CATARACT; Both Eyes (H25.813) 2.  Diabetes Type 2 No retinopathy  (E11.9) 3.  BLEPHARITIS; Right Upper Lid, Right Lower Lid, Left Upper Lid, Left Lower Lid (H01.001, H01.002,H01.004,H01.005) 4.  DERMATOCHALASIS, no surgery; Right Upper Lid, Left Upper Lid (H02.831, H02.834) 5.  Pinguecula; Both Eyes (H11.153) 6.  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  Cataract accounts for the patient's decreased vision. This visual impairment is not correctable with a tolerable change in glasses or contact lenses. Cataract surgery with an implantation of a new lens should significantly improve the visual and functional status of the patient. Discussed all risks, benefits, alternatives, and potential complications. Discussed the procedures and recovery. Patient desires to have surgery. A-scan ordered and performed today for intra-ocular lens calculations. The surgery will be performed in order to improve vision for driving, reading, and for eye examinations. Recommend phacoemulsification with intra-ocular lens. Recommend Dextenza for post-operative pain and inflammation. Left Eye. Dilates well - shugarcaine by protocol.  2.  Stressed importance of blood sugar and blood pressure control, and also yearly eye examinations. Discussed the need for ongoing proactive ocular exams and treatment, hopefully before visual symptoms develop.  3.  Recommend regular lid cleaning.  4.  Asymptomatic, recommend observation for now. Findings, prognosis and treatment options reviewed.  5.  Observe; Artificial tears as needed for irritation.  6.  RPE changes OU. Will monitor closely.  7.  IOP and OCT rNFL Normal. Monitor.

## 2021-07-29 ENCOUNTER — Ambulatory Visit (HOSPITAL_COMMUNITY): Payer: Medicare Other | Admitting: Anesthesiology

## 2021-07-29 ENCOUNTER — Ambulatory Visit (HOSPITAL_COMMUNITY)
Admission: RE | Admit: 2021-07-29 | Discharge: 2021-07-29 | Disposition: A | Discharge status: Home / Self Care | Payer: Medicare Other | Source: Ambulatory Visit | Attending: Ophthalmology | Admitting: Ophthalmology

## 2021-07-29 ENCOUNTER — Encounter (HOSPITAL_COMMUNITY): Payer: Self-pay | Admitting: Ophthalmology

## 2021-07-29 ENCOUNTER — Other Ambulatory Visit: Payer: Self-pay

## 2021-07-29 ENCOUNTER — Encounter (HOSPITAL_COMMUNITY)
Admission: RE | Disposition: A | Discharge status: Home / Self Care | Payer: Self-pay | Source: Ambulatory Visit | Attending: Ophthalmology

## 2021-07-29 DIAGNOSIS — H401411 Capsular glaucoma with pseudoexfoliation of lens, right eye, mild stage: Secondary | ICD-10-CM | POA: Insufficient documentation

## 2021-07-29 DIAGNOSIS — E1136 Type 2 diabetes mellitus with diabetic cataract: Secondary | ICD-10-CM | POA: Diagnosis not present

## 2021-07-29 DIAGNOSIS — H25812 Combined forms of age-related cataract, left eye: Secondary | ICD-10-CM | POA: Diagnosis not present

## 2021-07-29 DIAGNOSIS — H01001 Unspecified blepharitis right upper eyelid: Secondary | ICD-10-CM | POA: Diagnosis not present

## 2021-07-29 DIAGNOSIS — J449 Chronic obstructive pulmonary disease, unspecified: Secondary | ICD-10-CM | POA: Insufficient documentation

## 2021-07-29 DIAGNOSIS — H01002 Unspecified blepharitis right lower eyelid: Secondary | ICD-10-CM | POA: Insufficient documentation

## 2021-07-29 DIAGNOSIS — H353121 Nonexudative age-related macular degeneration, left eye, early dry stage: Secondary | ICD-10-CM | POA: Insufficient documentation

## 2021-07-29 DIAGNOSIS — H11153 Pinguecula, bilateral: Secondary | ICD-10-CM | POA: Insufficient documentation

## 2021-07-29 DIAGNOSIS — I10 Essential (primary) hypertension: Secondary | ICD-10-CM | POA: Diagnosis not present

## 2021-07-29 DIAGNOSIS — K219 Gastro-esophageal reflux disease without esophagitis: Secondary | ICD-10-CM | POA: Diagnosis not present

## 2021-07-29 DIAGNOSIS — H01005 Unspecified blepharitis left lower eyelid: Secondary | ICD-10-CM | POA: Insufficient documentation

## 2021-07-29 DIAGNOSIS — Z7984 Long term (current) use of oral hypoglycemic drugs: Secondary | ICD-10-CM | POA: Diagnosis not present

## 2021-07-29 DIAGNOSIS — H01004 Unspecified blepharitis left upper eyelid: Secondary | ICD-10-CM | POA: Diagnosis not present

## 2021-07-29 HISTORY — PX: CATARACT EXTRACTION W/PHACO: SHX586

## 2021-07-29 LAB — GLUCOSE, CAPILLARY: Glucose-Capillary: 108 mg/dL — ABNORMAL HIGH (ref 70–99)

## 2021-07-29 SURGERY — PHACOEMULSIFICATION, CATARACT, WITH IOL INSERTION
Anesthesia: General | Site: Eye | Laterality: Left

## 2021-07-29 MED ORDER — TROPICAMIDE 1 % OP SOLN
1.0000 [drp] | OPHTHALMIC | Status: AC | PRN
  Administered 2021-07-29 (×3): 1 [drp] via OPHTHALMIC
  Filled 2021-07-29: qty 2

## 2021-07-29 MED ORDER — MIDAZOLAM HCL 2 MG/2ML IJ SOLN
INTRAMUSCULAR | Status: DC | PRN
  Administered 2021-07-29: 2 mg via INTRAVENOUS

## 2021-07-29 MED ORDER — MIDAZOLAM HCL 2 MG/2ML IJ SOLN
INTRAMUSCULAR | Status: AC
  Filled 2021-07-29: qty 2

## 2021-07-29 MED ORDER — STERILE WATER FOR IRRIGATION IR SOLN
Status: DC | PRN
  Administered 2021-07-29: 250 mL

## 2021-07-29 MED ORDER — NEOMYCIN-POLYMYXIN-DEXAMETH 3.5-10000-0.1 OP SUSP
OPHTHALMIC | Status: DC | PRN
  Administered 2021-07-29: 1 [drp] via OPHTHALMIC

## 2021-07-29 MED ORDER — SODIUM HYALURONATE 23MG/ML IO SOSY
PREFILLED_SYRINGE | INTRAOCULAR | Status: DC | PRN
  Administered 2021-07-29: 0.6 mL via INTRAOCULAR

## 2021-07-29 MED ORDER — POVIDONE-IODINE 5 % OP SOLN
OPHTHALMIC | Status: DC | PRN
  Administered 2021-07-29: 1 via OPHTHALMIC

## 2021-07-29 MED ORDER — PHENYLEPHRINE HCL 2.5 % OP SOLN
1.0000 [drp] | OPHTHALMIC | Status: AC | PRN
  Administered 2021-07-29 (×3): 1 [drp] via OPHTHALMIC

## 2021-07-29 MED ORDER — EPINEPHRINE PF 1 MG/ML IJ SOLN
INTRAOCULAR | Status: DC | PRN
  Administered 2021-07-29: 500 mL

## 2021-07-29 MED ORDER — BSS IO SOLN
INTRAOCULAR | Status: DC | PRN
  Administered 2021-07-29: 15 mL via INTRAOCULAR

## 2021-07-29 MED ORDER — LIDOCAINE HCL (PF) 1 % IJ SOLN
INTRAOCULAR | Status: DC | PRN
  Administered 2021-07-29: 1 mL via OPHTHALMIC

## 2021-07-29 MED ORDER — SODIUM CHLORIDE 0.9% FLUSH
INTRAVENOUS | Status: DC | PRN
  Administered 2021-07-29: 5 mL via INTRAVENOUS

## 2021-07-29 MED ORDER — LIDOCAINE HCL 3.5 % OP GEL
1.0000 "application " | Freq: Once | OPHTHALMIC | Status: AC
  Administered 2021-07-29: 1 via OPHTHALMIC

## 2021-07-29 MED ORDER — TETRACAINE HCL 0.5 % OP SOLN
1.0000 [drp] | OPHTHALMIC | Status: AC | PRN
  Administered 2021-07-29 (×3): 1 [drp] via OPHTHALMIC

## 2021-07-29 MED ORDER — SODIUM HYALURONATE 10 MG/ML IO SOLUTION
PREFILLED_SYRINGE | INTRAOCULAR | Status: DC | PRN
  Administered 2021-07-29: 0.85 mL via INTRAOCULAR

## 2021-07-29 SURGICAL SUPPLY — 15 items
CATARACT SUITE SIGHTPATH (MISCELLANEOUS) ×2 IMPLANT
CLOTH BEACON ORANGE TIMEOUT ST (SAFETY) ×2 IMPLANT
EYE SHIELD UNIVERSAL CLEAR (GAUZE/BANDAGES/DRESSINGS) ×1 IMPLANT
FEE CATARACT SUITE SIGHTPATH (MISCELLANEOUS) ×1 IMPLANT
GLOVE SURG UNDER POLY LF SZ6.5 (GLOVE) ×1 IMPLANT
GLOVE SURG UNDER POLY LF SZ7 (GLOVE) ×2 IMPLANT
LENS IOL RAYNER 20.0 (Intraocular Lens) ×2 IMPLANT
LENS IOL RAYONE EMV 20.0 (Intraocular Lens) IMPLANT
NDL HYPO 18GX1.5 BLUNT FILL (NEEDLE) ×1 IMPLANT
NEEDLE HYPO 18GX1.5 BLUNT FILL (NEEDLE) ×2 IMPLANT
PAD ARMBOARD 7.5X6 YLW CONV (MISCELLANEOUS) ×2 IMPLANT
SYR TB 1ML LL NO SAFETY (SYRINGE) ×2 IMPLANT
TAPE SURG TRANSPORE 1 IN (GAUZE/BANDAGES/DRESSINGS) IMPLANT
TAPE SURGICAL TRANSPORE 1 IN (GAUZE/BANDAGES/DRESSINGS) ×1
WATER STERILE IRR 250ML POUR (IV SOLUTION) ×2 IMPLANT

## 2021-07-29 NOTE — Anesthesia Procedure Notes (Signed)
Procedure Name: MAC Date/Time: 07/29/2021 11:06 AM Performed by: Orlie Dakin, CRNA Pre-anesthesia Checklist: Patient identified, Emergency Drugs available, Suction available and Patient being monitored Patient Re-evaluated:Patient Re-evaluated prior to induction Oxygen Delivery Method: Nasal cannula Placement Confirmation: positive ETCO2

## 2021-07-29 NOTE — Transfer of Care (Signed)
Immediate Anesthesia Transfer of Care Note  Patient: Carl Jimenez  Procedure(s) Performed: CATARACT EXTRACTION PHACO AND INTRAOCULAR LENS PLACEMENT (IOC) (Left: Eye)  Patient Location: Short Stay  Anesthesia Type:MAC  Level of Consciousness: awake, alert  and oriented  Airway & Oxygen Therapy: Patient Spontanous Breathing  Post-op Assessment: Report given to RN, Post -op Vital signs reviewed and stable and Patient moving all extremities X 4  Post vital signs: Reviewed and stable  Last Vitals:  Vitals Value Taken Time  BP    Temp    Pulse    Resp    SpO2      Last Pain:  Vitals:   07/29/21 0953  TempSrc: Oral  PainSc: 0-No pain         Complications: No notable events documented.

## 2021-07-29 NOTE — Anesthesia Postprocedure Evaluation (Signed)
Anesthesia Post Note  Patient: Carl Jimenez  Procedure(s) Performed: CATARACT EXTRACTION PHACO AND INTRAOCULAR LENS PLACEMENT (IOC) (Left: Eye)  Patient location during evaluation: Phase II Anesthesia Type: General Level of consciousness: awake and alert and oriented Pain management: pain level controlled Vital Signs Assessment: post-procedure vital signs reviewed and stable Respiratory status: spontaneous breathing, nonlabored ventilation and respiratory function stable Cardiovascular status: stable and blood pressure returned to baseline Postop Assessment: no apparent nausea or vomiting Anesthetic complications: no   No notable events documented.   Last Vitals:  Vitals:   07/29/21 0953 07/29/21 1123  BP: (!) 184/98 (!) 159/74  Pulse: (!) 56 (!) 56  Resp:  18  Temp: 36.4 C 36.7 C  SpO2: 98% 98%    Last Pain:  Vitals:   07/29/21 1123  TempSrc: Oral  PainSc: 0-No pain                 Winfred Redel C Nuno Brubacher

## 2021-07-29 NOTE — Discharge Instructions (Signed)
Please discharge patient when stable, will follow up today with Dr. Mazel Villela at the [facility] office immediately following discharge.  Leave shield in place until visit.  All paperwork with discharge instructions will be given at the office.  [facility] Address:  [address]  [place], [place] [address]  

## 2021-07-29 NOTE — Op Note (Signed)
Date of procedure: 07/29/21  Pre-operative diagnosis: Visually significant age-related combined cataract, Left Eye (H25.812)  Post-operative diagnosis: Visually significant age-related combined cataract, Left Eye (H25.812)  Procedure: Removal of cataract via phacoemulsification and insertion of intra-ocular lens Rayner [id number] +20.0D into the capsular bag of the Left Eye  Attending surgeon: Gerda Diss. Faaris Arizpe, MD, MA  Anesthesia: MAC, Topical Akten  Complications: None  Estimated Blood Loss: <15m (minimal)  Specimens: None  Implants: As above  Indications:  Visually significant age-related cataract, Left Eye  Procedure:  The patient was seen and identified in the pre-operative area. The operative eye was identified and dilated.  The operative eye was marked.  Topical anesthesia was administered to the operative eye.     The patient was then to the operative suite and placed in the supine position.  A timeout was performed confirming the patient, procedure to be performed, and all other relevant information.   The patient's face was prepped and draped in the usual fashion for intra-ocular surgery.  A lid speculum was placed into the operative eye and the surgical microscope moved into place and focused.  An inferotemporal paracentesis was created using a 20 gauge paracentesis blade.  Shugarcaine was injected into the anterior chamber.  Viscoelastic was injected into the anterior chamber.  A temporal clear-corneal main wound incision was created using a 2.48mmicrokeratome.  A continuous curvilinear capsulorrhexis was initiated using an irrigating cystitome and completed using capsulorrhexis forceps.  Hydrodissection and hydrodeliniation were performed.  Viscoelastic was injected into the anterior chamber.  A phacoemulsification handpiece and a chopper as a second instrument were used to remove the nucleus and epinucleus. The irrigation/aspiration handpiece was used to remove any remaining  cortical material.   The capsular bag was reinflated with viscoelastic, checked, and found to be intact.  The intraocular lens was inserted into the capsular bag.  The irrigation/aspiration handpiece was used to remove any remaining viscoelastic.  The clear corneal wound and paracentesis wounds were then hydrated and checked with Weck-Cels to be watertight.  Maxitrol was instilled in the eye. The lid-speculum was removed.  The drape was removed.  The patient's face was cleaned with a wet and dry 4x4.    A clear shield was taped over the eye. The patient was taken to the post-operative care unit in good condition, having tolerated the procedure well.  Post-Op Instructions: [facility] and will receive all other orders and instructions.

## 2021-07-29 NOTE — Anesthesia Preprocedure Evaluation (Addendum)
Anesthesia Evaluation  °Patient identified by MRN, date of birth, ID band °Patient awake ° ° ° °Reviewed: °Allergy & Precautions, NPO status , Patient's Chart, lab work & pertinent test results ° °Airway °Mallampati: II ° °TM Distance: >3 FB °Neck ROM: Full ° ° ° Dental ° °(+) Edentulous Upper, Edentulous Lower °  °Pulmonary °COPD,  COPD inhaler,  °  °Pulmonary exam normal °breath sounds clear to auscultation ° ° ° ° ° ° Cardiovascular °hypertension, Pt. on medications °Normal cardiovascular exam °Rhythm:Regular Rate:Bradycardia ° ° °  °Neuro/Psych °PSYCHIATRIC DISORDERS Depression negative neurological ROS °   ° GI/Hepatic °Neg liver ROS, GERD  Medicated and Controlled,  °Endo/Other  °diabetes, Well Controlled, Type 2, Oral Hypoglycemic Agents ° Renal/GU °negative Renal ROS  °negative genitourinary °  °Musculoskeletal °negative musculoskeletal ROS °(+)  ° Abdominal °  °Peds °negative pediatric ROS °(+)  Hematology °negative hematology ROS °(+)   °Anesthesia Other Findings ° ° Reproductive/Obstetrics °negative OB ROS ° °  ° ° ° ° ° ° ° ° ° ° ° ° ° °  °  ° ° ° ° ° ° ° ° °Anesthesia Physical ° °Anesthesia Plan ° °ASA: 2 ° °Anesthesia Plan: MAC  ° °Post-op Pain Management: Minimal or no pain anticipated  ° °Induction:  ° °PONV Risk Score and Plan: 2 and Ondansetron ° °Airway Management Planned: Nasal Cannula and Natural Airway ° °Additional Equipment:  ° °Intra-op Plan:  ° °Post-operative Plan:  ° °Informed Consent: I have reviewed the patients History and Physical, chart, labs and discussed the procedure including the risks, benefits and alternatives for the proposed anesthesia with the patient or authorized representative who has indicated his/her understanding and acceptance.  ° ° ° ° ° °Plan Discussed with: Surgeon and CRNA ° °Anesthesia Plan Comments:   ° ° ° ° ° ° °Anesthesia Quick Evaluation ° °

## 2021-07-29 NOTE — Interval H&P Note (Signed)
History and Physical Interval Note:  07/29/2021 10:56 AM  Carl Jimenez  has presented today for surgery, with the diagnosis of combined forms age related cataract; left.  The various methods of treatment have been discussed with the patient and family. After consideration of risks, benefits and other options for treatment, the patient has consented to  Procedure(s) with comments: CATARACT EXTRACTION PHACO AND INTRAOCULAR LENS PLACEMENT (IOC) (Left) - left as a surgical intervention.  The patient's history has been reviewed, patient examined, no change in status, stable for surgery.  I have reviewed the patient's chart and labs.  Questions were answered to the patient's satisfaction.     Fabio Pierce

## 2021-08-01 ENCOUNTER — Encounter (HOSPITAL_COMMUNITY): Payer: Self-pay | Admitting: Ophthalmology

## 2021-08-04 DIAGNOSIS — H25811 Combined forms of age-related cataract, right eye: Secondary | ICD-10-CM | POA: Diagnosis not present

## 2021-08-08 ENCOUNTER — Encounter (HOSPITAL_COMMUNITY)
Admission: RE | Admit: 2021-08-08 | Discharge: 2021-08-08 | Disposition: A | Discharge status: Home / Self Care | Payer: Medicare Other | Source: Ambulatory Visit | Attending: Ophthalmology | Admitting: Ophthalmology

## 2021-08-08 ENCOUNTER — Other Ambulatory Visit: Payer: Self-pay

## 2021-08-08 ENCOUNTER — Encounter (HOSPITAL_COMMUNITY): Payer: Self-pay

## 2021-08-08 NOTE — Pre-Procedure Instructions (Signed)
Attempted to call for preop call and left a voicemail for him to call back about surgery.

## 2021-08-09 NOTE — H&P (Signed)
Surgical History & Physical  Patient Name: Carl Jimenez DOB: 25-Oct-1955  Surgery: Cataract extraction with intraocular lens implant phacoemulsification; Right Eye  Surgeon: Baruch Goldmann MD Surgery Date:  08-12-21 Pre-Op Date:  08-04-21  HPI: A 39 Yr. old male patient is returning after cataract post-op. The left eye is affected. Status post cataract post-op, which began 1 weeks ago: Since the last visit, the affected area is doing well. The patient's vision is improved. Patient is following medication instructions, the patient is using the prednisolone tid os, Ilevro qd os, and moxifloxacin tid os. The patient experiences no flashes, floater, shadow, curtain or veil. The patient is still bothered by glare when driving at night or looking at lights, they still look starbursty, and the patient thinks there may be some swelling around OS.The patient also complains of having a headache, mostly over OD. The patient also presents for pre op OD. The patient still has complaints of not being able to see when driving at night very well. The patient can't look ahead when he is driving without the glare from headlights and oncoming traffic bothering his vision. This is negatively affecting the patient's quality of life and the patient is unable to function adequately in life with the current level of vision. HPI was performed by Baruch Goldmann .  Medical History: Cataracts Macula Degeneration Glaucoma Arthritis Diabetes - DM Type 2 GERD Anxiety/Depression LDL Lung Problems  Review of Systems Negative Allergic/Immunologic Negative Cardiovascular Negative Constitutional Negative Ear, Nose, Mouth & Throat Negative Endocrine Negative Eyes Negative Gastrointestinal Negative Genitourinary Negative Hemotologic/Lymphatic Negative Integumentary Negative Musculoskeletal Negative Neurological Negative Psychiatry Negative Respiratory  Social   Former smoker   Medication Artificial Tears,  Moxifloxacin, Ilevro, Prednisolone, Albuterol, Citalopram, Metformin, Rosuvastatin,   Sx/Procedures Phaco c IOL OS, Hernia Sx,   Drug Allergies   NKDA  History & Physical: Heent: Cataract, Right Eye NECK: supple without bruits LUNGS: lungs clear to auscultation CV: regular rate and rhythm Abdomen: soft and non-tender Impression & Plan: Assessment: 1.  CATARACT EXTRACTION STATUS; Left Eye (Z98.42) 2.  COMBINED FORMS AGE RELATED CATARACT; Right Eye (H25.811) 3.  Myopia ; Left Eye (H52.12) 4.  NUCLEAR SCLEROSIS AGE RELATED; Right Eye (H25.11)  Plan: 1.  1 week after cataract surgery. Doing well with improved vision and normal eye pressure. Call with any problems or concerns. Stop Vigamox. Continue Ilevro 1 drop 1x/day for 3 more weeks. Continue Pred Acetate 1 drop 2x/day for 3 more weeks.  2.  Cataract accounts for the patient's decreased vision. This visual impairment is not correctable with a tolerable change in glasses or contact lenses. Cataract surgery with an implantation of a new lens should significantly improve the visual and functional status of the patient. Discussed all risks, benefits, alternatives, and potential complications. Discussed the procedures and recovery. Patient desires to have surgery. A-scan ordered and performed today for intra-ocular lens calculations. The surgery will be performed in order to improve vision for driving, reading, and for eye examinations. Recommend phacoemulsification with intra-ocular lens. Recommend Dextenza for post-operative pain and inflammation. Right Eye. Surgery required to correct imbalance of vision. Dilates well - shugarcaine by protocol.  3.   4.

## 2021-08-10 NOTE — Patient Instructions (Signed)
20    Your procedure is scheduled on: 08/16/2021  Report to [facility] Main Entrance at   7:45  AM.  Call this number if you have problems the morning of surgery: [phone]   Remember:   Follow instructions on letter from office regarding when to stop eating and drinking        No Smoking the day of procedure      Take these medicines the morning of surgery with A SIP OF WATER: Celexa, Omeprazole, and Flomax   Do not wear jewelry, make-up or nail polish.  Do not wear lotions, powders, or perfumes. You may wear deodorant.                Do not bring valuables to the hospital.  Contacts, dentures or bridgework may not be worn into surgery.  Leave suitcase in the car. After surgery it may be brought to your room.  For patients admitted to the hospital, checkout time is 11:00 AM the day of discharge.   Patients discharged the day of surgery will not be allowed to drive home. Upper Endoscopy, Adult Upper endoscopy is a procedure to look inside the upper GI (gastrointestinal) tract. The upper GI tract is made up of: The part of the body that moves food from your mouth to your stomach (esophagus). The stomach. The first part of your small intestine (duodenum). This procedure is also called esophagogastroduodenoscopy (EGD) or gastroscopy. In this procedure, your health care provider passes a thin, flexible tube (endoscope) through your mouth and down your esophagus into your stomach. A small camera is attached to the end of the tube. Images from the camera appear on a monitor in the exam room. During this procedure, your health care provider may also remove a small piece of tissue to be sent to a lab and examined under a microscope (biopsy). Your health care provider may do an upper endoscopy to diagnose cancers of the upper GI tract. You may also have this procedure to find the cause of other conditions, such as: Stomach pain. Heartburn. Pain or problems when swallowing. Nausea and  vomiting. Stomach bleeding. Stomach ulcers. Tell a health care provider about: Any allergies you have. All medicines you are taking, including vitamins, herbs, eye drops, creams, and over-the-counter medicines. Any problems you or family members have had with anesthetic medicines. Any blood disorders you have. Any surgeries you have had. Any medical conditions you have. Whether you are pregnant or may be pregnant. What are the risks? Generally, this is a safe procedure. However, problems may occur, including: Infection. Bleeding. Allergic reactions to medicines. A tear or hole (perforation) in the esophagus, stomach, or duodenum. What happens before the procedure? Staying hydrated Follow instructions from your health care provider about hydration, which may include: Up to 4 hours before the procedure - you may continue to drink clear liquids, such as water, clear fruit juice, black coffee, and plain tea.   Medicines Ask your health care provider about: Changing or stopping your regular medicines. This is especially important if you are taking diabetes medicines or blood thinners. Taking medicines such as aspirin and ibuprofen. These medicines can thin your blood. Do not take these medicines unless your health care provider tells you to take them. Taking over-the-counter medicines, vitamins, herbs, and supplements. General instructions Plan to have someone take you home from the hospital or clinic. If you will be going home right after the procedure, plan to have someone with you for 24 hours. Ask  your health care provider what steps will be taken to help prevent infection. What happens during the procedure?  An IV will be inserted into one of your veins. You may be given one or more of the following: A medicine to help you relax (sedative). A medicine to numb the throat (local anesthetic). You will lie on your left side on an exam table. Your health care provider will pass the  endoscope through your mouth and down your esophagus. Your health care provider will use the scope to check the inside of your esophagus, stomach, and duodenum. Biopsies may be taken. The endoscope will be removed. The procedure may vary among health care providers and hospitals. What happens after the procedure? Your blood pressure, heart rate, breathing rate, and blood oxygen level will be monitored until you leave the hospital or clinic. Do not drive for 24 hours if you were given a sedative during your procedure. When your throat is no longer numb, you may be given some fluids to drink. It is up to you to get the results of your procedure. Ask your health care provider, or the department that is doing the procedure, when your results will be ready. Summary Upper endoscopy is a procedure to look inside the upper GI tract. During the procedure, an IV will be inserted into one of your veins. You may be given a medicine to help you relax. A medicine will be used to numb your throat. The endoscope will be passed through your mouth and down your esophagus. This information is not intended to replace advice given to you by your health care provider. Make sure you discuss any questions you have with your health care provider. Document Revised: 12/05/2017 Document Reviewed: 11/12/2017 Elsevier Patient Education  2020 [employer]                                                                                                                                      EndoscopyCare After  Please read the instructions outlined below and refer to this sheet in the next few weeks. These discharge instructions provide you with general information on caring for yourself after you leave the hospital. Your doctor may also give you specific instructions. While your treatment has been planned according to the most current medical practices available, unavoidable complications occasionally occur. If you have any  problems or questions after discharge, please call your doctor. HOME CARE INSTRUCTIONS Activity You may resume your regular activity but move at a slower pace for the next 24 hours.  Take frequent rest periods for the next 24 hours.  Walking will help expel (get rid of) the air and reduce the bloated feeling in your abdomen.  No driving for 24 hours (because of the anesthesia (medicine) used during the test).  You may shower.  Do not sign any important legal documents or operate any machinery for 24 hours (because of the anesthesia  used during the test).  Nutrition Drink plenty of fluids.  You may resume your normal diet.  Begin with a light meal and progress to your normal diet.  Avoid alcoholic beverages for 24 hours or as instructed by your caregiver.  Medications You may resume your normal medications unless your caregiver tells you otherwise. What you can expect today You may experience abdominal discomfort such as a feeling of fullness or "gas" pains.  You may experience a sore throat for 2 to 3 days. This is normal. Gargling with salt water may help this.  Follow-up Your doctor will discuss the results of your test with you. SEEK IMMEDIATE MEDICAL CARE IF: You have excessive nausea (feeling sick to your stomach) and/or vomiting.  You have severe abdominal pain and distention (swelling).  You have trouble swallowing.  You have a temperature over 100 F (37.8 C).  You have rectal bleeding or vomiting of blood.  Document Released: 01/25/2004 Document Revised: 06/01/2011 Document Reviewed: 08/07/2007

## 2021-08-12 ENCOUNTER — Ambulatory Visit (HOSPITAL_COMMUNITY)
Admission: RE | Admit: 2021-08-12 | Discharge: 2021-08-12 | Disposition: A | Discharge status: Home / Self Care | Payer: Medicare Other | Source: Ambulatory Visit | Attending: Ophthalmology | Admitting: Ophthalmology

## 2021-08-12 ENCOUNTER — Encounter (HOSPITAL_COMMUNITY)
Admission: RE | Disposition: A | Discharge status: Home / Self Care | Payer: Self-pay | Source: Ambulatory Visit | Attending: Ophthalmology

## 2021-08-12 ENCOUNTER — Ambulatory Visit (HOSPITAL_BASED_OUTPATIENT_CLINIC_OR_DEPARTMENT_OTHER): Payer: Medicare Other | Admitting: Anesthesiology

## 2021-08-12 ENCOUNTER — Encounter (HOSPITAL_COMMUNITY): Payer: Self-pay | Admitting: Ophthalmology

## 2021-08-12 ENCOUNTER — Other Ambulatory Visit: Payer: Self-pay

## 2021-08-12 ENCOUNTER — Encounter (HOSPITAL_COMMUNITY)
Admission: RE | Admit: 2021-08-12 | Discharge: 2021-08-12 | Disposition: A | Discharge status: Home / Self Care | Payer: Medicare Other | Source: Ambulatory Visit | Attending: Gastroenterology | Admitting: Gastroenterology

## 2021-08-12 ENCOUNTER — Ambulatory Visit (HOSPITAL_COMMUNITY): Payer: Medicare Other | Admitting: Anesthesiology

## 2021-08-12 ENCOUNTER — Encounter (INDEPENDENT_AMBULATORY_CARE_PROVIDER_SITE_OTHER): Payer: Self-pay

## 2021-08-12 DIAGNOSIS — F32A Depression, unspecified: Secondary | ICD-10-CM | POA: Diagnosis not present

## 2021-08-12 DIAGNOSIS — Z7984 Long term (current) use of oral hypoglycemic drugs: Secondary | ICD-10-CM | POA: Insufficient documentation

## 2021-08-12 DIAGNOSIS — K219 Gastro-esophageal reflux disease without esophagitis: Secondary | ICD-10-CM | POA: Diagnosis not present

## 2021-08-12 DIAGNOSIS — E1136 Type 2 diabetes mellitus with diabetic cataract: Secondary | ICD-10-CM | POA: Insufficient documentation

## 2021-08-12 DIAGNOSIS — H25811 Combined forms of age-related cataract, right eye: Secondary | ICD-10-CM | POA: Diagnosis not present

## 2021-08-12 DIAGNOSIS — E119 Type 2 diabetes mellitus without complications: Secondary | ICD-10-CM

## 2021-08-12 DIAGNOSIS — J449 Chronic obstructive pulmonary disease, unspecified: Secondary | ICD-10-CM

## 2021-08-12 DIAGNOSIS — Z79899 Other long term (current) drug therapy: Secondary | ICD-10-CM | POA: Diagnosis not present

## 2021-08-12 DIAGNOSIS — Z87891 Personal history of nicotine dependence: Secondary | ICD-10-CM | POA: Diagnosis not present

## 2021-08-12 DIAGNOSIS — I10 Essential (primary) hypertension: Secondary | ICD-10-CM

## 2021-08-12 HISTORY — PX: CATARACT EXTRACTION W/PHACO: SHX586

## 2021-08-12 LAB — GLUCOSE, CAPILLARY: Glucose-Capillary: 118 mg/dL — ABNORMAL HIGH (ref 70–99)

## 2021-08-12 SURGERY — PHACOEMULSIFICATION, CATARACT, WITH IOL INSERTION
Anesthesia: Monitor Anesthesia Care | Site: Eye | Laterality: Right

## 2021-08-12 MED ORDER — SODIUM HYALURONATE 10 MG/ML IO SOLUTION
PREFILLED_SYRINGE | INTRAOCULAR | Status: DC | PRN
  Administered 2021-08-12: 0.85 mL via INTRAOCULAR

## 2021-08-12 MED ORDER — BSS IO SOLN
INTRAOCULAR | Status: DC | PRN
Start: 2021-08-12 — End: 2021-08-12
  Administered 2021-08-12: 15 mL via INTRAOCULAR

## 2021-08-12 MED ORDER — LIDOCAINE HCL 3.5 % OP GEL
1.0000 "application " | Freq: Once | OPHTHALMIC | Status: AC
  Administered 2021-08-12: 1 via OPHTHALMIC

## 2021-08-12 MED ORDER — POVIDONE-IODINE 5 % OP SOLN
OPHTHALMIC | Status: DC | PRN
  Administered 2021-08-12: 1 via OPHTHALMIC

## 2021-08-12 MED ORDER — SODIUM CHLORIDE 0.9% FLUSH
INTRAVENOUS | Status: DC | PRN
  Administered 2021-08-12: 5 mL via INTRAVENOUS

## 2021-08-12 MED ORDER — SODIUM CHLORIDE (PF) 0.9 % IJ SOLN
INTRAMUSCULAR | Status: AC
  Filled 2021-08-12: qty 10

## 2021-08-12 MED ORDER — EPINEPHRINE PF 1 MG/ML IJ SOLN
INTRAOCULAR | Status: DC | PRN
  Administered 2021-08-12: 1 mL via OPHTHALMIC

## 2021-08-12 MED ORDER — TROPICAMIDE 1 % OP SOLN
1.0000 [drp] | OPHTHALMIC | Status: AC | PRN
  Administered 2021-08-12 (×3): 1 [drp] via OPHTHALMIC

## 2021-08-12 MED ORDER — NEOMYCIN-POLYMYXIN-DEXAMETH 3.5-10000-0.1 OP SUSP
OPHTHALMIC | Status: DC | PRN
  Administered 2021-08-12: 1 [drp] via OPHTHALMIC

## 2021-08-12 MED ORDER — PHENYLEPHRINE HCL 2.5 % OP SOLN
1.0000 [drp] | OPHTHALMIC | Status: AC | PRN
  Administered 2021-08-12 (×3): 1 [drp] via OPHTHALMIC

## 2021-08-12 MED ORDER — TETRACAINE HCL 0.5 % OP SOLN
1.0000 [drp] | OPHTHALMIC | Status: AC | PRN
  Administered 2021-08-12 (×3): 1 [drp] via OPHTHALMIC

## 2021-08-12 MED ORDER — SODIUM HYALURONATE 23MG/ML IO SOSY
PREFILLED_SYRINGE | INTRAOCULAR | Status: DC | PRN
Start: 2021-08-12 — End: 2021-08-12
  Administered 2021-08-12: 0.6 mL via INTRAOCULAR

## 2021-08-12 MED ORDER — EPINEPHRINE PF 1 MG/ML IJ SOLN
INTRAOCULAR | Status: DC | PRN
  Administered 2021-08-12: 500 mL

## 2021-08-12 MED ORDER — MIDAZOLAM HCL 2 MG/2ML IJ SOLN
INTRAMUSCULAR | Status: DC | PRN
  Administered 2021-08-12: 2 mg via INTRAVENOUS

## 2021-08-12 MED ORDER — STERILE WATER FOR IRRIGATION IR SOLN
Status: DC | PRN
  Administered 2021-08-12: 250 mL

## 2021-08-12 MED ORDER — MIDAZOLAM HCL 2 MG/2ML IJ SOLN
INTRAMUSCULAR | Status: AC
  Filled 2021-08-12: qty 2

## 2021-08-12 SURGICAL SUPPLY — 17 items
CATARACT SUITE SIGHTPATH (MISCELLANEOUS) ×2 IMPLANT
CLOTH BEACON ORANGE TIMEOUT ST (SAFETY) ×2 IMPLANT
EYE SHIELD UNIVERSAL CLEAR (GAUZE/BANDAGES/DRESSINGS) ×1 IMPLANT
FEE CATARACT SUITE SIGHTPATH (MISCELLANEOUS) ×1 IMPLANT
GLOVE SURG UNDER POLY LF SZ6.5 (GLOVE) ×1 IMPLANT
GLOVE SURG UNDER POLY LF SZ7 (GLOVE) ×2 IMPLANT
GOWN STRL REUS W/ TWL LRG LVL3 (GOWN DISPOSABLE) IMPLANT
GOWN STRL REUS W/TWL LRG LVL3 (GOWN DISPOSABLE) ×1
LENS IOL RAYNER 20.5 (Intraocular Lens) ×2 IMPLANT
LENS IOL RAYONE EMV 20.5 (Intraocular Lens) IMPLANT
NDL HYPO 18GX1.5 BLUNT FILL (NEEDLE) ×1 IMPLANT
NEEDLE HYPO 18GX1.5 BLUNT FILL (NEEDLE) ×2 IMPLANT
PAD ARMBOARD 7.5X6 YLW CONV (MISCELLANEOUS) ×2 IMPLANT
SYR TB 1ML LL NO SAFETY (SYRINGE) ×2 IMPLANT
TAPE SURG TRANSPORE 1 IN (GAUZE/BANDAGES/DRESSINGS) IMPLANT
TAPE SURGICAL TRANSPORE 1 IN (GAUZE/BANDAGES/DRESSINGS) ×1
WATER STERILE IRR 250ML POUR (IV SOLUTION) ×2 IMPLANT

## 2021-08-12 NOTE — Transfer of Care (Signed)
Immediate Anesthesia Transfer of Care Note  Patient: Carl Jimenez  Procedure(s) Performed: CATARACT EXTRACTION PHACO AND INTRAOCULAR LENS PLACEMENT (IOC) (Right: Eye)  Patient Location: Short Stay  Anesthesia Type:MAC  Level of Consciousness: awake, alert  and oriented  Airway & Oxygen Therapy: Patient Spontanous Breathing  Post-op Assessment: Report given to RN, Post -op Vital signs reviewed and stable and Patient moving all extremities X 4  Post vital signs: Reviewed and stable  Last Vitals:  Vitals Value Taken Time  BP    Temp    Pulse    Resp    SpO2      Last Pain:  Vitals:   08/12/21 0755  TempSrc: Oral  PainSc: 0-No pain         Complications: No notable events documented.

## 2021-08-12 NOTE — Interval H&P Note (Signed)
History and Physical Interval Note:  08/12/2021 8:22 AM  Carl Jimenez  has presented today for surgery, with the diagnosis of combined forms age related cataract; right.  The various methods of treatment have been discussed with the patient and family. After consideration of risks, benefits and other options for treatment, the patient has consented to  Procedure(s) with comments: CATARACT EXTRACTION PHACO AND INTRAOCULAR LENS PLACEMENT (IOC) (Right) - right as a surgical intervention.  The patient's history has been reviewed, patient examined, no change in status, stable for surgery.  I have reviewed the patient's chart and labs.  Questions were answered to the patient's satisfaction.     Baruch Goldmann

## 2021-08-12 NOTE — Anesthesia Postprocedure Evaluation (Signed)
Anesthesia Post Note  Patient: Carl Jimenez  Procedure(s) Performed: CATARACT EXTRACTION PHACO AND INTRAOCULAR LENS PLACEMENT (IOC) (Right: Eye)  Patient location during evaluation: Phase II Anesthesia Type: MAC Level of consciousness: awake and alert and oriented Pain management: pain level controlled Vital Signs Assessment: post-procedure vital signs reviewed and stable Respiratory status: spontaneous breathing, nonlabored ventilation and respiratory function stable Cardiovascular status: blood pressure returned to baseline and stable Postop Assessment: no apparent nausea or vomiting Anesthetic complications: no   No notable events documented.   Last Vitals:  Vitals:   08/12/21 0847 08/12/21 0849  BP:  (!) 151/82  Pulse: (!) 56   Resp: 17   Temp: 37 C   SpO2: 99%     Last Pain:  Vitals:   08/12/21 0847  TempSrc: Oral  PainSc:                  Marzella Miracle C Niomi Valent

## 2021-08-12 NOTE — Discharge Instructions (Signed)
Please discharge patient when stable, will follow up today with Dr. Asher Torpey at the [facility] [place] [place] office immediately following discharge.  Leave shield in place until visit.  All paperwork with discharge instructions will be given at the office.  [facility] [place] Address:  [address]  [place], [place] [address]  

## 2021-08-12 NOTE — Anesthesia Preprocedure Evaluation (Signed)
Anesthesia Evaluation  Patient identified by MRN, date of birth, ID band Patient awake    Reviewed: Allergy & Precautions, NPO status , Patient's Chart, lab work & pertinent test results  Airway Mallampati: II  TM Distance: >3 FB Neck ROM: Full    Dental  (+) Edentulous Upper, Edentulous Lower   Pulmonary COPD,  COPD inhaler,    Pulmonary exam normal breath sounds clear to auscultation       Cardiovascular hypertension, Pt. on medications Normal cardiovascular exam Rhythm:Regular Rate:Bradycardia     Neuro/Psych PSYCHIATRIC DISORDERS Depression negative neurological ROS     GI/Hepatic Neg liver ROS, GERD  Medicated and Controlled,  Endo/Other  diabetes, Well Controlled, Type 2, Oral Hypoglycemic Agents  Renal/GU negative Renal ROS  negative genitourinary   Musculoskeletal negative musculoskeletal ROS (+)   Abdominal   Peds negative pediatric ROS (+)  Hematology negative hematology ROS (+)   Anesthesia Other Findings   Reproductive/Obstetrics negative OB ROS                             Anesthesia Physical  Anesthesia Plan  ASA: 2  Anesthesia Plan: MAC   Post-op Pain Management: Minimal or no pain anticipated   Induction:   PONV Risk Score and Plan: 2 and Ondansetron  Airway Management Planned: Nasal Cannula and Natural Airway  Additional Equipment:   Intra-op Plan:   Post-operative Plan:   Informed Consent: I have reviewed the patients History and Physical, chart, labs and discussed the procedure including the risks, benefits and alternatives for the proposed anesthesia with the patient or authorized representative who has indicated his/her understanding and acceptance.       Plan Discussed with: Surgeon and CRNA  Anesthesia Plan Comments:         Anesthesia Quick Evaluation

## 2021-08-12 NOTE — Op Note (Signed)
Date of procedure: 08/12/21  Pre-operative diagnosis:  Visually significant combined form age-related cataract, Right Eye (H25.811)  Post-operative diagnosis:  Visually significant combined form age-related cataract, Right Eye (H25.811)  Procedure: Removal of cataract via phacoemulsification and insertion of intra-ocular lens Rayner [id number] +20.5D into the capsular bag of the Right Eye  Attending surgeon: Gerda Diss. Tracen Mahler, MD, MA  Anesthesia: MAC, Topical Akten  Complications: None  Estimated Blood Loss: <6m (minimal)  Specimens: None  Implants: As above  Indications:  Visually significant age-related cataract, Right Eye  Procedure:  The patient was seen and identified in the pre-operative area. The operative eye was identified and dilated.  The operative eye was marked.  Topical anesthesia was administered to the operative eye.     The patient was then to the operative suite and placed in the supine position.  A timeout was performed confirming the patient, procedure to be performed, and all other relevant information.   The patient's face was prepped and draped in the usual fashion for intra-ocular surgery.  A lid speculum was placed into the operative eye and the surgical microscope moved into place and focused.  A superotemporal paracentesis was created using a 20 gauge paracentesis blade.  Shugarcaine was injected into the anterior chamber.  Viscoelastic was injected into the anterior chamber.  A temporal clear-corneal main wound incision was created using a 2.422mmicrokeratome.  A continuous curvilinear capsulorrhexis was initiated using an irrigating cystitome and completed using capsulorrhexis forceps.  Hydrodissection and hydrodeliniation were performed.  Viscoelastic was injected into the anterior chamber.  A phacoemulsification handpiece and a chopper as a second instrument were used to remove the nucleus and epinucleus. The irrigation/aspiration handpiece was used to remove any  remaining cortical material.   The capsular bag was reinflated with viscoelastic, checked, and found to be intact.  The intraocular lens was inserted into the capsular bag.  The irrigation/aspiration handpiece was used to remove any remaining viscoelastic.  The clear corneal wound and paracentesis wounds were then hydrated and checked with Weck-Cels to be watertight.  The lid-speculum was removed.  The drape was removed.  The patient's face was cleaned with a wet and dry 4x4.   Maxitrol was instilled in the eye. A clear shield was taped over the eye. The patient was taken to the post-operative care unit in good condition, having tolerated the procedure well.  Post-Op Instructions: [facility] and will receive all other orders and instructions.

## 2021-08-12 NOTE — Anesthesia Procedure Notes (Signed)
Procedure Name: MAC Date/Time: 08/12/2021 8:32 AM Performed by: Orlie Dakin, CRNA Pre-anesthesia Checklist: Patient identified, Emergency Drugs available, Suction available and Patient being monitored Patient Re-evaluated:Patient Re-evaluated prior to induction Oxygen Delivery Method: Nasal cannula Induction Type: IV induction Placement Confirmation: positive ETCO2

## 2021-08-15 ENCOUNTER — Encounter (HOSPITAL_COMMUNITY): Payer: Self-pay | Admitting: Ophthalmology

## 2021-08-16 ENCOUNTER — Ambulatory Visit (HOSPITAL_BASED_OUTPATIENT_CLINIC_OR_DEPARTMENT_OTHER): Payer: Medicare Other | Admitting: Anesthesiology

## 2021-08-16 ENCOUNTER — Encounter (HOSPITAL_COMMUNITY)
Admission: RE | Disposition: A | Discharge status: Home / Self Care | Payer: Self-pay | Source: Ambulatory Visit | Attending: Gastroenterology

## 2021-08-16 ENCOUNTER — Ambulatory Visit (HOSPITAL_COMMUNITY)
Admission: RE | Admit: 2021-08-16 | Discharge: 2021-08-16 | Disposition: A | Discharge status: Home / Self Care | Payer: Medicare Other | Source: Ambulatory Visit | Attending: Gastroenterology | Admitting: Gastroenterology

## 2021-08-16 ENCOUNTER — Ambulatory Visit (HOSPITAL_COMMUNITY): Payer: Medicare Other | Admitting: Anesthesiology

## 2021-08-16 ENCOUNTER — Encounter (HOSPITAL_COMMUNITY): Payer: Self-pay | Admitting: Gastroenterology

## 2021-08-16 DIAGNOSIS — E7800 Pure hypercholesterolemia, unspecified: Secondary | ICD-10-CM | POA: Insufficient documentation

## 2021-08-16 DIAGNOSIS — I10 Essential (primary) hypertension: Secondary | ICD-10-CM | POA: Insufficient documentation

## 2021-08-16 DIAGNOSIS — K219 Gastro-esophageal reflux disease without esophagitis: Secondary | ICD-10-CM | POA: Diagnosis not present

## 2021-08-16 DIAGNOSIS — E039 Hypothyroidism, unspecified: Secondary | ICD-10-CM | POA: Insufficient documentation

## 2021-08-16 DIAGNOSIS — K449 Diaphragmatic hernia without obstruction or gangrene: Secondary | ICD-10-CM | POA: Diagnosis not present

## 2021-08-16 DIAGNOSIS — K3189 Other diseases of stomach and duodenum: Secondary | ICD-10-CM | POA: Diagnosis not present

## 2021-08-16 DIAGNOSIS — E119 Type 2 diabetes mellitus without complications: Secondary | ICD-10-CM | POA: Insufficient documentation

## 2021-08-16 DIAGNOSIS — K2289 Other specified disease of esophagus: Secondary | ICD-10-CM | POA: Diagnosis not present

## 2021-08-16 DIAGNOSIS — R1310 Dysphagia, unspecified: Secondary | ICD-10-CM | POA: Diagnosis not present

## 2021-08-16 DIAGNOSIS — K2100 Gastro-esophageal reflux disease with esophagitis, without bleeding: Secondary | ICD-10-CM | POA: Insufficient documentation

## 2021-08-16 DIAGNOSIS — J449 Chronic obstructive pulmonary disease, unspecified: Secondary | ICD-10-CM | POA: Diagnosis not present

## 2021-08-16 DIAGNOSIS — T18128A Food in esophagus causing other injury, initial encounter: Secondary | ICD-10-CM

## 2021-08-16 DIAGNOSIS — Z8619 Personal history of other infectious and parasitic diseases: Secondary | ICD-10-CM | POA: Diagnosis not present

## 2021-08-16 DIAGNOSIS — F32A Depression, unspecified: Secondary | ICD-10-CM | POA: Diagnosis not present

## 2021-08-16 DIAGNOSIS — K31A11 Gastric intestinal metaplasia without dysplasia, involving the antrum: Secondary | ICD-10-CM | POA: Insufficient documentation

## 2021-08-16 DIAGNOSIS — B9681 Helicobacter pylori [H. pylori] as the cause of diseases classified elsewhere: Secondary | ICD-10-CM

## 2021-08-16 HISTORY — PX: ESOPHAGOGASTRODUODENOSCOPY (EGD) WITH PROPOFOL: SHX5813

## 2021-08-16 HISTORY — PX: BIOPSY: SHX5522

## 2021-08-16 LAB — GLUCOSE, CAPILLARY: Glucose-Capillary: 145 mg/dL — ABNORMAL HIGH (ref 70–99)

## 2021-08-16 SURGERY — ESOPHAGOGASTRODUODENOSCOPY (EGD) WITH PROPOFOL
Anesthesia: General

## 2021-08-16 MED ORDER — OMEPRAZOLE 40 MG PO CPDR: 40.0000 mg | DELAYED_RELEASE_CAPSULE | Freq: Every day | ORAL | 3 refills | Status: DC

## 2021-08-16 MED ORDER — PROPOFOL 500 MG/50ML IV EMUL
INTRAVENOUS | Status: DC | PRN
  Administered 2021-08-16: 200 ug/kg/min via INTRAVENOUS

## 2021-08-16 MED ORDER — PROPOFOL 10 MG/ML IV BOLUS
INTRAVENOUS | Status: DC | PRN
  Administered 2021-08-16: 70 mg via INTRAVENOUS

## 2021-08-16 MED ORDER — LACTATED RINGERS IV SOLN: INTRAVENOUS | Status: DC

## 2021-08-16 MED ORDER — LIDOCAINE 2% (20 MG/ML) 5 ML SYRINGE
INTRAMUSCULAR | Status: DC | PRN
  Administered 2021-08-16: 50 mg via INTRAVENOUS

## 2021-08-16 NOTE — Discharge Instructions (Addendum)
You are being discharged to home.  Resume your previous diet.  We are waiting for your pathology results.  Decrease omeprazole to 40 mg qday.

## 2021-08-16 NOTE — Anesthesia Preprocedure Evaluation (Addendum)
Anesthesia Evaluation  Patient identified by MRN, date of birth, ID band Patient awake    Reviewed: Allergy & Precautions, NPO status , Patient's Chart, lab work & pertinent test results  Airway Mallampati: II  TM Distance: >3 FB Neck ROM: Full    Dental  (+) Edentulous Upper, Edentulous Lower   Pulmonary COPD,  COPD inhaler,    Pulmonary exam normal breath sounds clear to auscultation       Cardiovascular hypertension, Pt. on medications Normal cardiovascular exam Rhythm:Regular Rate:Bradycardia     Neuro/Psych PSYCHIATRIC DISORDERS Depression negative neurological ROS     GI/Hepatic Neg liver ROS, GERD  Medicated and Controlled,  Endo/Other  diabetes, Well Controlled, Type 2, Oral Hypoglycemic Agents  Renal/GU negative Renal ROS  negative genitourinary   Musculoskeletal negative musculoskeletal ROS (+)   Abdominal   Peds negative pediatric ROS (+)  Hematology negative hematology ROS (+)   Anesthesia Other Findings   Reproductive/Obstetrics negative OB ROS                             Anesthesia Physical  Anesthesia Plan  ASA: 2  Anesthesia Plan: General   Post-op Pain Management: Minimal or no pain anticipated   Induction: Intravenous  PONV Risk Score and Plan: TIVA  Airway Management Planned: Nasal Cannula and Natural Airway  Additional Equipment:   Intra-op Plan:   Post-operative Plan:   Informed Consent: I have reviewed the patients History and Physical, chart, labs and discussed the procedure including the risks, benefits and alternatives for the proposed anesthesia with the patient or authorized representative who has indicated his/her understanding and acceptance.       Plan Discussed with: Surgeon and CRNA  Anesthesia Plan Comments:        Anesthesia Quick Evaluation

## 2021-08-16 NOTE — Op Note (Signed)
[facility] Patient Name: Carl Jimenez Procedure Date: 08/16/2021 9:43 AM MRN: [id number] Date of Birth: February 03, 1956 Attending MD: Maylon Peppers ,  CSN: [id number] Age: 66 Admit Type: Outpatient Procedure:                Upper GI endoscopy Indications:              Follow-up of gastro-esophageal reflux disease,                            history of food impaction, history of H. pylori Providers:                Maylon Peppers, Hughie Closs RN, RN, Caprice Kluver,                            Aram Candela Referring MD:              Medicines:                Monitored Anesthesia Care Complications:            No immediate complications. Estimated Blood Loss:     Estimated blood loss: none. Procedure:                Pre-Anesthesia Assessment:                           - Prior to the procedure, a History and Physical                            was performed, and patient medications, allergies                            and sensitivities were reviewed. The patient's                            tolerance of previous anesthesia was reviewed.                           - The risks and benefits of the procedure and the                            sedation options and risks were discussed with the                            patient. All questions were answered and informed                            consent was obtained.                           - ASA Grade Assessment: III - A patient with severe                            systemic disease.                           After obtaining informed consent, the  endoscope was                            passed under direct vision. Throughout the                            procedure, the patient's blood pressure, pulse, and                            oxygen saturations were monitored continuously. The                            [id number] scope was introduced through the                            mouth, and advanced to the second part of  duodenum.                            The upper GI endoscopy was accomplished without                            difficulty. The patient tolerated the procedure                            well. Scope In: 9:58:50 AM Scope Out: 10:06:25 AM Total Procedure Duration: 0 hours 7 minutes 35 seconds  Findings:      A 1 cm hiatal hernia was present.      Scattered islands of salmon-colored mucosa were present from 39 to 40       cm. No other visible abnormalities were present. The maximum       longitudinal extent of these esophageal mucosal changes was 1 cm in       length. Biopsies were taken with a cold forceps for histology.      The entire examined stomach was normal. Biopsies were taken with a cold       forceps for Helicobacter pylori testing.      Patchy mildly erythematous mucosa without active bleeding and with no       stigmata of bleeding was found in the duodenal bulb.      The exam of the duodenum was otherwise normal. Impression:               - 1 cm hiatal hernia.                           - Salmon-colored mucosa suspicious for                            short-segment Barrett's esophagus. Biopsied.                           - Normal stomach. Biopsied.                           - Erythematous duodenopathy. Moderate Sedation:      Per Anesthesia Care Recommendation:           - Discharge patient to  home (ambulatory).                           - Resume previous diet.                           - Await pathology results.                           - Decrease omeprazole to 40 mg qday. Procedure Code(s):        --- Professional ---                           [id number], Esophagogastroduodenoscopy, flexible,                            transoral; with biopsy, single or multiple Diagnosis Code(s):        --- Professional ---                           K44.9, Diaphragmatic hernia without obstruction or                            gangrene                           K22.8, Other specified diseases  of esophagus                           K31.89, Other diseases of stomach and duodenum                           K21.9, Gastro-esophageal reflux disease without                            esophagitis CPT copyright 2019 American Medical Association. All rights reserved. The codes documented in this report are preliminary and upon coder review may  be revised to meet current compliance requirements. Maylon Peppers, MD Maylon Peppers,  08/16/2021 10:10:36 AM This report has been signed electronically. Number of Addenda: 0

## 2021-08-16 NOTE — Transfer of Care (Signed)
Immediate Anesthesia Transfer of Care Note  Patient: Carl Jimenez  Procedure(s) Performed: ESOPHAGOGASTRODUODENOSCOPY (EGD) WITH PROPOFOL BIOPSY  Patient Location: Short Stay  Anesthesia Type:MAC  Level of Consciousness: sedated and responds to stimulation  Airway & Oxygen Therapy: Patient Spontanous Breathing and Patient connected to nasal cannula oxygen  Post-op Assessment: Report given to RN, Post -op Vital signs reviewed and stable and Patient moving all extremities  Post vital signs: Reviewed and stable  Last Vitals:  Vitals Value Taken Time  BP 125/65 08/16/21 1010  Temp 36.5 C 08/16/21 1010  Pulse 54 08/16/21 1010  Resp 16 08/16/21 1010  SpO2 99 % 08/16/21 1010    Last Pain:  Vitals:   08/16/21 1010  TempSrc: Oral  PainSc: 0-No pain      Patients Stated Pain Goal: 6 (07/26/41 8887)  Complications: No notable events documented.

## 2021-08-16 NOTE — Anesthesia Postprocedure Evaluation (Signed)
Anesthesia Post Note  Patient: Carl Jimenez  Procedure(s) Performed: ESOPHAGOGASTRODUODENOSCOPY (EGD) WITH PROPOFOL BIOPSY  Patient location during evaluation: Phase II Anesthesia Type: General Level of consciousness: awake and alert and oriented Pain management: pain level controlled Vital Signs Assessment: post-procedure vital signs reviewed and stable Respiratory status: spontaneous breathing, nonlabored ventilation and respiratory function stable Cardiovascular status: blood pressure returned to baseline and stable Postop Assessment: no apparent nausea or vomiting Anesthetic complications: no   No notable events documented.   Last Vitals:  Vitals:   08/16/21 0857 08/16/21 1010  BP: (!) 169/83 125/65  Pulse: (!) 52 (!) 54  Resp: 16 16  Temp: 36.5 C 36.5 C  SpO2: 97% 99%    Last Pain:  Vitals:   08/16/21 1010  TempSrc: Oral  PainSc: 0-No pain                 Zoya Sprecher C Emir Nack

## 2021-08-16 NOTE — Interval H&P Note (Signed)
History and Physical Interval Note:  08/16/2021 8:45 AM  Carl Jimenez  has presented today for surgery, with the diagnosis of Hpylori.  The various methods of treatment have been discussed with the patient and family. After consideration of risks, benefits and other options for treatment, the patient has consented to  Procedure(s) with comments: ESOPHAGOGASTRODUODENOSCOPY (EGD) WITH PROPOFOL (N/A) - 905 as a surgical intervention.  The patient's history has been reviewed, patient examined, no change in status, stable for surgery.  I have reviewed the patient's chart and labs.  Questions were answered to the patient's satisfaction.     [name] Mayorga

## 2021-08-17 LAB — SURGICAL PATHOLOGY

## 2021-08-18 ENCOUNTER — Encounter (HOSPITAL_COMMUNITY): Payer: Self-pay | Admitting: Gastroenterology

## 2021-10-20 ENCOUNTER — Ambulatory Visit (INDEPENDENT_AMBULATORY_CARE_PROVIDER_SITE_OTHER): Payer: Medicare Other | Admitting: Gastroenterology

## 2021-10-20 ENCOUNTER — Encounter (INDEPENDENT_AMBULATORY_CARE_PROVIDER_SITE_OTHER): Payer: Self-pay

## 2021-11-04 ENCOUNTER — Encounter: Payer: Self-pay | Admitting: Urology

## 2021-11-04 ENCOUNTER — Ambulatory Visit (INDEPENDENT_AMBULATORY_CARE_PROVIDER_SITE_OTHER): Payer: Medicare Other | Admitting: Urology

## 2021-11-04 DIAGNOSIS — N281 Cyst of kidney, acquired: Secondary | ICD-10-CM

## 2021-11-04 DIAGNOSIS — N200 Calculus of kidney: Secondary | ICD-10-CM | POA: Diagnosis not present

## 2021-11-04 LAB — URINALYSIS, ROUTINE W REFLEX MICROSCOPIC
Bilirubin, UA: NEGATIVE
Ketones, UA: NEGATIVE
Leukocytes,UA: NEGATIVE
Nitrite, UA: NEGATIVE
Protein,UA: NEGATIVE
RBC, UA: NEGATIVE
Specific Gravity, UA: 1.015 (ref 1.005–1.030)
Urobilinogen, Ur: 0.2 mg/dL (ref 0.2–1.0)
pH, UA: 6 (ref 5.0–7.5)

## 2021-11-04 NOTE — Patient Instructions (Signed)
Dietary Guidelines to Help Prevent Kidney Stones Kidney stones are deposits of minerals and salts that form inside your kidneys. Your risk of developing kidney stones may be greater depending on your diet, your lifestyle, the medicines you take, and whether you have certain medical conditions. Most people can lower their chances of developing kidney stones by following the instructions below. Your dietitian may give you more specific instructions depending on your overall health and the type of kidney stones you tend to develop. What are tips for following this plan? Reading food labels  Choose foods with "no salt added" or "low-salt" labels. Limit your salt (sodium) intake to less than 1,500 mg a day. Choose foods with calcium for each meal and snack. Try to eat about 300 mg of calcium at each meal. Foods that contain 200-500 mg of calcium a serving include: 8 oz (237 mL) of milk, calcium-fortifiednon-dairy milk, and calcium-fortifiedfruit juice. Calcium-fortified means that calcium has been added to these drinks. 8 oz (237 mL) of kefir, yogurt, and soy yogurt. 4 oz (114 g) of tofu. 1 oz (28 g) of cheese. 1 cup (150 g) of dried figs. 1 cup (91 g) of cooked broccoli. One 3 oz (85 g) can of sardines or mackerel. Most people need 1,000-1,500 mg of calcium a day. Talk to your dietitian about how much calcium is recommended for you. Shopping Buy plenty of fresh fruits and vegetables. Most people do not need to avoid fruits and vegetables, even if these foods contain nutrients that may contribute to kidney stones. When shopping for convenience foods, choose: Whole pieces of fruit. Pre-made salads with dressing on the side. Low-fat fruit and yogurt smoothies. Avoid buying frozen meals or prepared deli foods. These can be high in sodium. Look for foods with live cultures, such as yogurt and kefir. Choose high-fiber grains, such as whole-wheat breads, oat bran, and wheat cereals. Cooking Do not add  salt to food when cooking. Place a salt shaker on the table and allow each person to add his or her own salt to taste. Use vegetable protein, such as beans, textured vegetable protein (TVP), or tofu, instead of meat in pasta, casseroles, and soups. Meal planning Eat less salt, if told by your dietitian. To do this: Avoid eating processed or pre-made food. Avoid eating fast food. Eat less animal protein, including cheese, meat, poultry, or fish, if told by your dietitian. To do this: Limit the number of times you have meat, poultry, fish, or cheese each week. Eat a diet free of meat at least 2 days a week. Eat only one serving each day of meat, poultry, fish, or seafood. When you prepare animal protein, cut pieces into small portion sizes. For most meat and fish, one serving is about the size of the palm of your hand. Eat at least five servings of fresh fruits and vegetables each day. To do this: Keep fruits and vegetables on hand for snacks. Eat one piece of fruit or a handful of berries with breakfast. Have a salad and fruit at lunch. Have two kinds of vegetables at dinner. Limit foods that are high in a substance called oxalate. These include: Spinach (cooked), rhubarb, beets, sweet potatoes, and Swiss chard. Peanuts. Potato chips, french fries, and baked potatoes with skin on. Nuts and nut products. Chocolate. If you regularly take a diuretic medicine, make sure to eat at least 1 or 2 servings of fruits or vegetables that are high in potassium each day. These include: Avocado. Banana. Orange, prune,   carrot, or tomato juice. Baked potato. Cabbage. Beans and split peas. Lifestyle  Drink enough fluid to keep your urine pale yellow. This is the most important thing you can do. Spread your fluid intake throughout the day. If you drink alcohol: Limit how much you use to: 0-1 drink a day for women who are not pregnant. 0-2 drinks a day for men. Be aware of how much alcohol is in your  drink. In the [place] one drink equals one 12 oz bottle of beer (355 mL), one 5 oz glass of wine (148 mL), or one 1 oz glass of hard liquor (44 mL). Lose weight if told by your health care provider. Work with your dietitian to find an eating plan and weight loss strategies that work best for you. General information Talk to your health care provider and dietitian about taking daily supplements. You may be told the following depending on your health and the cause of your kidney stones: Not to take supplements with vitamin C. To take a calcium supplement. To take a daily probiotic supplement. To take other supplements such as magnesium, fish oil, or vitamin B6. Take over-the-counter and prescription medicines only as told by your health care provider. These include supplements. What foods should I limit? Limit your intake of the following foods, or eat them as told by your dietitian. Vegetables Spinach. Rhubarb. Beets. Canned vegetables. Pickles. Olives. Baked potatoes with skin. Grains Wheat bran. Baked goods. Salted crackers. Cereals high in sugar. Meats and other proteins Nuts. Nut butters. Large portions of meat, poultry, or fish. Salted, precooked, or cured meats, such as sausages, meat loaves, and hot dogs. Dairy Cheese. Beverages Regular soft drinks. Regular vegetable juice. Seasonings and condiments Seasoning blends with salt. Salad dressings. Soy sauce. Ketchup. Barbecue sauce. Other foods Canned soups. Canned pasta sauce. Casseroles. Pizza. Lasagna. Frozen meals. Potato chips. French fries. The items listed above may not be a complete list of foods and beverages you should limit. Contact a dietitian for more information. What foods should I avoid? Talk to your dietitian about specific foods you should avoid based on the type of kidney stones you have and your overall health. Fruits Grapefruit. The item listed above may not be a complete list of foods and beverages you should  avoid. Contact a dietitian for more information. Summary Kidney stones are deposits of minerals and salts that form inside your kidneys. You can lower your risk of kidney stones by making changes to your diet. The most important thing you can do is drink enough fluid. Drink enough fluid to keep your urine pale yellow. Talk to your dietitian about how much calcium you should have each day, and eat less salt and animal protein as told by your dietitian. This information is not intended to replace advice given to you by your health care provider. Make sure you discuss any questions you have with your health care provider. Document Revised: 02/21/2021 Document Reviewed: 02/21/2021 Elsevier Patient Education  2023 Elsevier Inc.  

## 2021-11-04 NOTE — Progress Notes (Addendum)
? ?11/04/2021 ?12:44 PM  ? ?Darryl Nestleavid Treiber ?01-30-1956 ?[id number] ? ?Referring provider: Donetta PottsWilliams, Gordon F, MD ?[address] ?[place],  [place] [id number] ? ?Nephrolithiasis and right renal cyst ? ? ?HPI: ?Mr Carl Jimenez is a 66yo here for evaluation of nephrolithiasis. He has a stone event in 06/2021 and passed a 2mm calculus. No flank pain currently. No significant LUTS. No hematuria. No other complaints today. Ct also showed a right 1.3cm minimally complex cyst.  ? ? ?PMH: ?Past Medical History:  ?Diagnosis Date  ? COPD (chronic obstructive pulmonary disease) (HCC)   ? Depression   ? Diabetes mellitus without complication (HCC)   ? Hypertension   ? Psoriasis   ? ? ?Surgical History: ?Past Surgical History:  ?Procedure Laterality Date  ? BIOPSY  06/15/2021  ? Procedure: BIOPSY;  Surgeon: Marguerita Merlesastaneda Mayorga, Reuel Boomaniel, MD;  Location: [facility];  Service: Gastroenterology;;  gastric, distal, esophageal, mid-esophageal  ? BIOPSY  08/16/2021  ? Procedure: BIOPSY;  Surgeon: Dolores Frameastaneda Mayorga, Daniel, MD;  Location: [facility];  Service: Gastroenterology;;  ? CATARACT EXTRACTION W/PHACO Left 07/29/2021  ? Procedure: CATARACT EXTRACTION PHACO AND INTRAOCULAR LENS PLACEMENT (IOC);  Surgeon: Fabio PierceWrzosek, James, MD;  Location: [facility];  Service: Ophthalmology;  Laterality: Left;  CDE 5.89  ? CATARACT EXTRACTION W/PHACO Right 08/12/2021  ? Procedure: CATARACT EXTRACTION PHACO AND INTRAOCULAR LENS PLACEMENT (IOC);  Surgeon: Fabio PierceWrzosek, James, MD;  Location: [facility];  Service: Ophthalmology;  Laterality: Right;  CDE: 6.69  ? ESOPHAGOGASTRODUODENOSCOPY (EGD) WITH PROPOFOL N/A 06/15/2021  ? Procedure: ESOPHAGOGASTRODUODENOSCOPY (EGD) WITH PROPOFOL;  Surgeon: Dolores Frameastaneda Mayorga, Daniel, MD;  [facility]: [facility];  Service: Gastroenterology;  Laterality: N/A;  ? ESOPHAGOGASTRODUODENOSCOPY (EGD) WITH PROPOFOL N/A 08/16/2021  ? Procedure: ESOPHAGOGASTRODUODENOSCOPY (EGD) WITH PROPOFOL;  Surgeon: Dolores Frameastaneda Mayorga, Daniel, MD;  Location: [facility];   Service: Gastroenterology;  Laterality: N/A;  905  ? HERNIA REPAIR    ? ? ?Home Medications:  ?Allergies as of 11/04/2021   ?No Known Allergies ?  ? ?  ?Medication List  ?  ? ?  ? Accurate as of Nov 04, 2021 12:44 PM. If you have any questions, ask your nurse or doctor.  ?  ?  ? ?  ? ?albuterol 108 (90 Base) MCG/ACT inhaler ?Commonly known as: VENTOLIN HFA ?Inhale 2 puffs into the lungs every 6 (six) hours as needed for wheezing or shortness of breath. ?  ?citalopram 20 MG tablet ?Commonly known as: CELEXA ?Take 20 mg by mouth daily. ?  ?clobetasol cream 0.05 % ?Commonly known as: TEMOVATE ?Apply 1 application topically daily as needed (irritation). ?  ?CoQ10 50 MG Caps ?Take 50 mg by mouth daily. ?  ?empagliflozin 10 MG Tabs tablet ?Commonly known as: JARDIANCE ?Take 10 mg by mouth daily. ?  ?Ilevro 0.3 % ophthalmic suspension ?Generic drug: nepafenac ?1 drop daily. ?  ?metFORMIN 500 MG 24 hr tablet ?Commonly known as: GLUCOPHAGE-XR ?Take 500 mg by mouth 2 (two) times daily. ?  ?moxifloxacin 0.5 % ophthalmic solution ?Commonly known as: VIGAMOX ?Apply 1 drop to eye 3 (three) times daily. ?  ?multivitamin tablet ?Take 1 tablet by mouth daily. ?  ?naproxen 500 MG tablet ?Commonly known as: Naprosyn ?Take 1 tablet (500 mg total) by mouth 2 (two) times daily with a meal. ?  ?omeprazole 40 MG capsule ?Commonly known as: PRILOSEC ?Take 1 capsule (40 mg total) by mouth daily. ?  ?ondansetron 4 MG disintegrating tablet ?Commonly known as: ZOFRAN-ODT ?Take 1 tablet (4 mg total) by mouth  every 8 (eight) hours as needed for nausea. ?What changed: when to take this ?  ?prednisoLONE acetate 1 % ophthalmic suspension ?Commonly known as: PRED FORTE ?1 drop 3 (three) times daily. ?  ?rosuvastatin 20 MG tablet ?Commonly known as: CRESTOR ?Take 20 mg by mouth at bedtime. ?  ?tamsulosin 0.4 MG Caps capsule ?Commonly known as: FLOMAX ?Take 1 capsule (0.4 mg total) by mouth 2 (two) times daily. ?  ?tetracycline 500 MG capsule ?Commonly  known as: SUMYCIN ?Take 500 mg by mouth 4 (four) times daily. ?  ? ?  ? ? ?Allergies: No Known Allergies ? ?Family History: ?No family history on file. ? ?Social History:  reports that he has never smoked. He has never used smokeless tobacco. He reports that he does not drink alcohol and does not use drugs. ? ?ROS: ?All other review of systems were reviewed and are negative except what is noted above in HPI ? ?Physical Exam: ?There were no vitals taken for this visit.  ?Constitutional:  Alert and oriented, No acute distress. ?HEENT: [place] AT, moist mucus membranes.  Trachea midline, no masses. ?Cardiovascular: No clubbing, cyanosis, or edema. ?Respiratory: Normal respiratory effort, no increased work of breathing. ?GI: Abdomen is soft, nontender, nondistended, no abdominal masses ?GU: No CVA tenderness.  ?Lymph: No cervical or inguinal lymphadenopathy. ?Skin: No rashes, bruises or suspicious lesions. ?Neurologic: Grossly intact, no focal deficits, moving all 4 extremities. ?Psychiatric: Normal mood and affect. ? ?Laboratory Data: ?Lab Results  ?Component Value Date  ? WBC 10.9 (H) 07/17/2021  ? HGB 15.0 07/17/2021  ? HCT 44.5 07/17/2021  ? MCV 89.5 07/17/2021  ? PLT 250 07/17/2021  ? ? ?Lab Results  ?Component Value Date  ? CREATININE 1.31 (H) 07/17/2021  ? ? ?No results found for: PSA ? ?No results found for: TESTOSTERONE ? ?No results found for: HGBA1C ? ?Urinalysis ?   ?Component Value Date/Time  ? COLORURINE YELLOW 07/17/2021 1916  ? APPEARANCEUR HAZY (A) 07/17/2021 1916  ? LABSPEC 1.023 07/17/2021 1916  ? PHURINE 5.0 07/17/2021 1916  ? GLUCOSEU >=500 (A) 07/17/2021 1916  ? HGBUR MODERATE (A) 07/17/2021 1916  ? BILIRUBINUR NEGATIVE 07/17/2021 1916  ? KETONESUR 5 (A) 07/17/2021 1916  ? PROTEINUR NEGATIVE 07/17/2021 1916  ? NITRITE NEGATIVE 07/17/2021 1916  ? LEUKOCYTESUR NEGATIVE 07/17/2021 1916  ? ? ?Lab Results  ?Component Value Date  ? BACTERIA NONE SEEN 07/17/2021  ? ? ?Pertinent Imaging: ?Ct 07/18/2021: Images  reviewed and discussed with the patient ?No results found for this or any previous visit. ? ?No results found for this or any previous visit. ? ?No results found for this or any previous visit. ? ?No results found for this or any previous visit. ? ?No results found for this or any previous visit. ? ?No results found for this or any previous visit. ? ?No results found for this or any previous visit. ? ?Results for orders placed during the hospital encounter of 07/17/21 ? ?CT Renal Stone Study ? ?Narrative ?CLINICAL DATA:  Left flank pain starting yesterday.  Nausea. ? ?EXAM: ?CT ABDOMEN AND PELVIS WITHOUT CONTRAST ? ?TECHNIQUE: ?Multidetector CT imaging of the abdomen and pelvis was performed ?following the standard protocol without IV contrast. ? ?RADIATION DOSE REDUCTION: This exam was performed according to the ?departmental dose-optimization program which includes automated ?exposure control, adjustment of the mA and/or kV according to ?patient size and/or use of iterative reconstruction technique. ? ?COMPARISON:  Report from CT scan dated 11/16/2014 ? ?FINDINGS: ?Lower chest: Mild descending  thoracic aortic atherosclerotic ?calcification. ? ?Hepatobiliary: 0.8 by 0.6 cm hypodense lesion in segment 3 of the ?liver on image 22 series 2, nonspecific. Gallbladder unremarkable. ?No biliary dilatation. ? ?Pancreas: Unremarkable ? ?Spleen: Unremarkable ? ?Adrenals/Urinary Tract: Both adrenal glands appear normal. ? ?Mild left hydronephrosis mild left hydroureter extending down to a 2 ?mm in length left distal ureteral calculus located about 9 mm ?proximal to the UVJ shown on image 82 series 6. ? ?Left mid kidney hypodense lesion measuring 2.1 cm craniocaudad, ?fluid density, appearance favors a cyst. ? ?1.3 by 1.1 cm hypodense right mid kidney lesion has faint ?calcification along its superior and inferior margin on image 63 ?series 5, and is most likely a small complex cyst although ?enhancement characteristics are  not interrogated. ? ?Stomach/Bowel: Descending and sigmoid colon diverticulosis. ?Prominent stool throughout the colon favors constipation. Normal ?appendix. ? ?Vascular/Lymphatic: Fusiform infrarenal a

## 2022-02-02 DIAGNOSIS — H26493 Other secondary cataract, bilateral: Secondary | ICD-10-CM | POA: Diagnosis not present

## 2022-02-02 DIAGNOSIS — H353121 Nonexudative age-related macular degeneration, left eye, early dry stage: Secondary | ICD-10-CM | POA: Diagnosis not present

## 2022-02-16 DIAGNOSIS — H26492 Other secondary cataract, left eye: Secondary | ICD-10-CM | POA: Diagnosis not present

## 2022-02-22 DIAGNOSIS — Z1329 Encounter for screening for other suspected endocrine disorder: Secondary | ICD-10-CM | POA: Diagnosis not present

## 2022-02-22 DIAGNOSIS — E7800 Pure hypercholesterolemia, unspecified: Secondary | ICD-10-CM | POA: Diagnosis not present

## 2022-02-22 DIAGNOSIS — E559 Vitamin D deficiency, unspecified: Secondary | ICD-10-CM | POA: Diagnosis not present

## 2022-02-22 DIAGNOSIS — J449 Chronic obstructive pulmonary disease, unspecified: Secondary | ICD-10-CM | POA: Diagnosis not present

## 2022-02-22 DIAGNOSIS — Z0001 Encounter for general adult medical examination with abnormal findings: Secondary | ICD-10-CM | POA: Diagnosis not present

## 2022-02-22 DIAGNOSIS — E1169 Type 2 diabetes mellitus with other specified complication: Secondary | ICD-10-CM | POA: Diagnosis not present

## 2022-03-28 DIAGNOSIS — Z0001 Encounter for general adult medical examination with abnormal findings: Secondary | ICD-10-CM | POA: Diagnosis not present

## 2022-03-28 DIAGNOSIS — B070 Plantar wart: Secondary | ICD-10-CM | POA: Diagnosis not present

## 2022-03-28 DIAGNOSIS — E1169 Type 2 diabetes mellitus with other specified complication: Secondary | ICD-10-CM | POA: Diagnosis not present

## 2022-03-28 DIAGNOSIS — J449 Chronic obstructive pulmonary disease, unspecified: Secondary | ICD-10-CM | POA: Diagnosis not present

## 2022-03-28 DIAGNOSIS — F32A Depression, unspecified: Secondary | ICD-10-CM | POA: Diagnosis not present

## 2022-03-28 DIAGNOSIS — I700 Atherosclerosis of aorta: Secondary | ICD-10-CM | POA: Diagnosis not present

## 2022-03-28 DIAGNOSIS — L409 Psoriasis, unspecified: Secondary | ICD-10-CM | POA: Diagnosis not present

## 2022-05-09 ENCOUNTER — Other Ambulatory Visit: Payer: Self-pay

## 2022-05-09 DIAGNOSIS — N200 Calculus of kidney: Secondary | ICD-10-CM

## 2022-05-10 ENCOUNTER — Ambulatory Visit: Payer: Medicare Other | Admitting: Urology

## 2022-05-22 ENCOUNTER — Ambulatory Visit (HOSPITAL_COMMUNITY)
Admission: RE | Admit: 2022-05-22 | Discharge: 2022-05-22 | Disposition: A | Discharge status: Home / Self Care | Payer: Medicare Other | Source: Ambulatory Visit | Attending: Urology | Admitting: Urology

## 2022-05-22 DIAGNOSIS — N281 Cyst of kidney, acquired: Secondary | ICD-10-CM | POA: Diagnosis not present

## 2022-05-22 DIAGNOSIS — N200 Calculus of kidney: Secondary | ICD-10-CM | POA: Diagnosis not present

## 2022-05-22 DIAGNOSIS — Z87442 Personal history of urinary calculi: Secondary | ICD-10-CM | POA: Diagnosis not present

## 2022-05-31 ENCOUNTER — Ambulatory Visit: Payer: Medicare Other | Admitting: Urology

## 2022-05-31 DIAGNOSIS — W5503XA Scratched by cat, initial encounter: Secondary | ICD-10-CM | POA: Diagnosis not present

## 2022-05-31 DIAGNOSIS — L03114 Cellulitis of left upper limb: Secondary | ICD-10-CM | POA: Diagnosis not present

## 2022-06-01 ENCOUNTER — Emergency Department (HOSPITAL_COMMUNITY): Payer: Medicare Other

## 2022-06-01 ENCOUNTER — Emergency Department (HOSPITAL_COMMUNITY)
Admission: EM | Admit: 2022-06-01 | Discharge: 2022-06-01 | Disposition: A | Discharge status: Home / Self Care | Payer: Medicare Other | Attending: Emergency Medicine | Admitting: Emergency Medicine

## 2022-06-01 ENCOUNTER — Other Ambulatory Visit: Payer: Self-pay

## 2022-06-01 DIAGNOSIS — J449 Chronic obstructive pulmonary disease, unspecified: Secondary | ICD-10-CM | POA: Diagnosis not present

## 2022-06-01 DIAGNOSIS — E119 Type 2 diabetes mellitus without complications: Secondary | ICD-10-CM | POA: Diagnosis not present

## 2022-06-01 DIAGNOSIS — Z7984 Long term (current) use of oral hypoglycemic drugs: Secondary | ICD-10-CM | POA: Insufficient documentation

## 2022-06-01 DIAGNOSIS — W5501XA Bitten by cat, initial encounter: Secondary | ICD-10-CM | POA: Insufficient documentation

## 2022-06-01 DIAGNOSIS — M7989 Other specified soft tissue disorders: Secondary | ICD-10-CM | POA: Diagnosis not present

## 2022-06-01 DIAGNOSIS — R600 Localized edema: Secondary | ICD-10-CM | POA: Diagnosis not present

## 2022-06-01 DIAGNOSIS — L0390 Cellulitis, unspecified: Secondary | ICD-10-CM

## 2022-06-01 DIAGNOSIS — Z7951 Long term (current) use of inhaled steroids: Secondary | ICD-10-CM | POA: Insufficient documentation

## 2022-06-01 DIAGNOSIS — L03114 Cellulitis of left upper limb: Secondary | ICD-10-CM | POA: Diagnosis not present

## 2022-06-01 DIAGNOSIS — I10 Essential (primary) hypertension: Secondary | ICD-10-CM | POA: Insufficient documentation

## 2022-06-01 DIAGNOSIS — S61452A Open bite of left hand, initial encounter: Secondary | ICD-10-CM | POA: Insufficient documentation

## 2022-06-01 LAB — BASIC METABOLIC PANEL
Anion gap: 10 (ref 5–15)
BUN: 15 mg/dL (ref 8–23)
CO2: 25 mmol/L (ref 22–32)
Calcium: 9 mg/dL (ref 8.9–10.3)
Chloride: 105 mmol/L (ref 98–111)
Creatinine, Ser: 0.87 mg/dL (ref 0.61–1.24)
GFR, Estimated: >60 mL/min (ref >60)
Glucose, Bld: 92 mg/dL (ref 70–99)
Potassium: 3.7 mmol/L (ref 3.5–5.1)
Sodium: 140 mmol/L (ref 135–145)

## 2022-06-01 LAB — CBC WITH DIFFERENTIAL/PLATELET
Abs Immature Granulocytes: 0.03 10*3/uL (ref 0.00–0.07)
Basophils Absolute: 0.1 10*3/uL (ref 0.0–0.1)
Basophils Relative: 1 %
Eosinophils Absolute: 0.3 10*3/uL (ref 0.0–0.5)
Eosinophils Relative: 3 %
HCT: 43.9 % (ref 39.0–52.0)
Hemoglobin: 14.5 g/dL (ref 13.0–17.0)
Immature Granulocytes: 0 %
Lymphocytes Relative: 26 %
Lymphs Abs: 2.4 10*3/uL (ref 0.7–4.0)
MCH: 29.1 pg (ref 26.0–34.0)
MCHC: 33 g/dL (ref 30.0–36.0)
MCV: 88 fL (ref 80.0–100.0)
Monocytes Absolute: 0.9 10*3/uL (ref 0.1–1.0)
Monocytes Relative: 10 %
Neutro Abs: 5.6 10*3/uL (ref 1.7–7.7)
Neutrophils Relative %: 60 %
Platelets: 226 10*3/uL (ref 150–400)
RBC: 4.99 MIL/uL (ref 4.22–5.81)
RDW: 13.2 % (ref 11.5–15.5)
WBC: 9.2 10*3/uL (ref 4.0–10.5)
nRBC: 0 % (ref 0.0–0.2)

## 2022-06-01 MED ORDER — CIPROFLOXACIN HCL 500 MG PO TABS: 500.0000 mg | ORAL_TABLET | Freq: Two times a day (BID) | ORAL | 0 refills | Status: DC

## 2022-06-01 MED ORDER — SODIUM CHLORIDE 0.9 % IV SOLN
3.0000 g | Freq: Once | INTRAVENOUS | Status: AC
  Administered 2022-06-01: 3 g via INTRAVENOUS
  Filled 2022-06-01: qty 8

## 2022-06-01 NOTE — ED Provider Notes (Signed)
[facility] EMERGENCY DEPARTMENT Provider Note   CSN: [id number] Arrival date & time: 06/01/22  1516     History Chief Complaint  Patient presents with   Animal Bite    Carl Jimenez is a 66 y.o. male patient with history of diabetes, COPD, and hypertension who presents the emergency department with a cat bite that occurred 2 days ago.  Patient was placed on Augmentin which she has been taking as prescribed.  Despite taking Augmentin he noticed increasing redness, swelling, and heat from the left hand that is now extending proximally up the left forearm.  He denies any fever or chills.  Denies purulent drainage.   Animal Bite      Home Medications Prior to Admission medications   Medication Sig Start Date End Date Taking? Authorizing Provider  ciprofloxacin (CIPRO) 500 MG tablet Take 1 tablet (500 mg total) by mouth every 12 (twelve) hours. 06/01/22  Yes Meredeth Ide, Jaedin Trumbo M, PA-C  albuterol (VENTOLIN HFA) 108 (90 Base) MCG/ACT inhaler Inhale 2 puffs into the lungs every 6 (six) hours as needed for wheezing or shortness of breath. 05/20/21   [provider]  citalopram (CELEXA) 20 MG tablet Take 20 mg by mouth daily. 04/30/21   [provider]  clobetasol cream (TEMOVATE) 0.05 % Apply 1 application topically daily as needed (irritation). 03/09/21   [provider]  Coenzyme Q10 (COQ10) 50 MG CAPS Take 50 mg by mouth daily.    [provider]  empagliflozin (JARDIANCE) 10 MG TABS tablet Take 10 mg by mouth daily.    [provider]  ILEVRO 0.3 % ophthalmic suspension 1 drop daily. Patient not taking: Reported on 11/04/2021 07/11/21   [provider]  metFORMIN (GLUCOPHAGE-XR) 500 MG 24 hr tablet Take 500 mg by mouth 2 (two) times daily. 03/09/21   [provider]  moxifloxacin (VIGAMOX) 0.5 % ophthalmic solution Apply 1 drop to eye 3 (three) times daily. Patient not taking: Reported on 11/04/2021 07/11/21   [provider]   Multiple Vitamin (MULTIVITAMIN) tablet Take 1 tablet by mouth daily.    [provider]  naproxen (NAPROSYN) 500 MG tablet Take 1 tablet (500 mg total) by mouth 2 (two) times daily with a meal. Patient not taking: Reported on 11/04/2021 07/17/21   Eber Hong, MD  omeprazole (PRILOSEC) 40 MG capsule Take 1 capsule (40 mg total) by mouth daily. 08/16/21   Dolores Frame, MD  ondansetron (ZOFRAN-ODT) 4 MG disintegrating tablet Take 1 tablet (4 mg total) by mouth every 8 (eight) hours as needed for nausea. Patient taking differently: Take 4 mg by mouth at bedtime. 07/17/21   Eber Hong, MD  prednisoLONE acetate (PRED FORTE) 1 % ophthalmic suspension 1 drop 3 (three) times daily. 07/11/21   [provider]  rosuvastatin (CRESTOR) 20 MG tablet Take 20 mg by mouth at bedtime. 04/30/21   [provider]  tamsulosin (FLOMAX) 0.4 MG CAPS capsule Take 1 capsule (0.4 mg total) by mouth 2 (two) times daily. Patient not taking: Reported on 11/04/2021 07/17/21   Eber Hong, MD  tetracycline (SUMYCIN) 500 MG capsule Take 500 mg by mouth 4 (four) times daily. Patient not taking: Reported on 11/04/2021    [provider]      Allergies    Patient has no known allergies.    Review of Systems   Review of Systems  All other systems reviewed and are negative.   Physical Exam Updated Vital Signs BP (!) 173/77   Pulse Marland Kitchen)  57   Temp 98.1 F (36.7 C) (Oral)   Resp 18   Ht 6' (1.829 m)   Wt 88.9 kg   SpO2 97%   BMI 26.58 kg/m  Physical Exam Vitals and nursing note reviewed.  Constitutional:      Appearance: Normal appearance.  HENT:     Head: Normocephalic and atraumatic.  Eyes:     General:        Right eye: No discharge.        Left eye: No discharge.     Conjunctiva/sclera: Conjunctivae normal.  Pulmonary:     Effort: Pulmonary effort is normal.  Skin:    General: Skin is warm and dry.     Findings: No rash.     Comments: There is erythema,  edema, and warmth over the dorsal aspect of the left hand.  There is a well-healing wound at the distal left forearm.  Swelling and redness does extend proximally into the wrist and left forearm.  He has full range of motion in the wrist and strong 2+ radial pulse felt on the left.  Neurological:     General: No focal deficit present.     Mental Status: He is alert.  Psychiatric:        Mood and Affect: Mood normal.        Behavior: Behavior normal.     ED Results / Procedures / Treatments   Labs (all labs ordered are listed, but only abnormal results are displayed) Labs Reviewed  CBC WITH DIFFERENTIAL/PLATELET  BASIC METABOLIC PANEL    EKG None  Radiology DG Hand Complete Left  Result Date: 06/01/2022 CLINICAL DATA:  Animal bite. Bit by cat on Monday, redness and swelling. EXAM: LEFT HAND - COMPLETE 3+ VIEW COMPARISON:  None Available. FINDINGS: There is no evidence of fracture or dislocation. Degenerative change involving the thumb carpal metacarpal metacarpal phalangeal joint. Mild osteoarthritis of the metacarpal phalangeal joints of the second and third digits, distal interphalangeal joints of the digits digits and thumb interphalangeal joint. No erosion or periostitis. No bony destruction. Soft tissue edema is most prominent dorsally. No soft tissue gas or radiopaque foreign body. IMPRESSION: 1. Soft tissue edema without soft tissue gas or radiopaque foreign body. 2. No acute osseous abnormality. 3. 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 Electronically Signed   By: Narda Rutherford M.D.   On: 06/01/2022 16:51    Procedures Procedures    Medications Ordered in ED Medications  Ampicillin-Sulbactam (UNASYN) 3 g in sodium chloride 0.9 % 100 mL IVPB (3 g Intravenous New Bag/Given 06/01/22 1956)    ED Course/ Medical Decision Making/ A&P Clinical Course as of 06/01/22 2100  Thu Jun 01, 2022  1837 I spoke with pharmacy who recommends IV Unasyn and will plan to likely  transition over to ciprofloxacin at discharge. [CF]  2049 CBC with Differential Normal. [CF]  2049 Basic metabolic panel Normal. [CF]  2049 On reevaluation, patient is resting comfortably in the ED.  Unasyn has finished running.  I went over the plan with the patient the bedside.  He is in agreement.  He will observe the wound for the next 24 hours after starting ciprofloxacin and will return immediately if symptoms are getting worse. [CF]    Clinical Course User Index [CF] Teressa Lower, PA-C                           Medical Decision Making Courtez Twaddle is a  66 y.o. male patient who presents to the emergency department today for further evaluation of a left hand cat bite.  There is certainly some overlying cellulitis and the patient has been on Augmentin and failed.  I will give him IV Unasyn and plan to get some basic labs.  After labs result and IV Unasyn rounds I will plan to discharge him and transition him to ciprofloxacin.  Patient is safe for discharge at this time.  Strict return precautions were discussed.  He is on board with plan.   Amount and/or Complexity of Data Reviewed Labs: ordered. Decision-making details documented in ED Course. Radiology: ordered.  Risk Prescription drug management.    Final Clinical Impression(s) / ED Diagnoses Final diagnoses:  Cat bite, initial encounter  Cellulitis, unspecified cellulitis site    Rx / DC Orders ED Discharge Orders          Ordered    ciprofloxacin (CIPRO) 500 MG tablet  Every 12 hours        06/01/22 2051              Teressa Lower, PA-C 06/01/22 2100    Gerhard Munch, MD 06/01/22 2151

## 2022-06-01 NOTE — ED Triage Notes (Signed)
Pt was bite by cat on Monday, left hand red and swollen, cat currently in quarantine, per pt.

## 2022-06-01 NOTE — Discharge Instructions (Addendum)
Pick up your new antibiotic tomorrow and you can take that as prescribed every 12 hours.  As we discussed, I want you to observe the wound for 24 hours while taking this antibiotic to see improvement if you do not see improvement you to come back to the emergency department.  Otherwise follow-up with your primary care doctor.

## 2022-06-13 DIAGNOSIS — I10 Essential (primary) hypertension: Secondary | ICD-10-CM | POA: Diagnosis not present

## 2022-06-13 DIAGNOSIS — E1169 Type 2 diabetes mellitus with other specified complication: Secondary | ICD-10-CM | POA: Diagnosis not present

## 2022-06-13 DIAGNOSIS — I700 Atherosclerosis of aorta: Secondary | ICD-10-CM | POA: Diagnosis not present

## 2022-06-13 DIAGNOSIS — F32A Depression, unspecified: Secondary | ICD-10-CM | POA: Diagnosis not present

## 2022-06-13 DIAGNOSIS — Z23 Encounter for immunization: Secondary | ICD-10-CM | POA: Diagnosis not present

## 2022-06-13 DIAGNOSIS — B070 Plantar wart: Secondary | ICD-10-CM | POA: Diagnosis not present

## 2022-06-13 DIAGNOSIS — L409 Psoriasis, unspecified: Secondary | ICD-10-CM | POA: Diagnosis not present

## 2022-06-13 DIAGNOSIS — J449 Chronic obstructive pulmonary disease, unspecified: Secondary | ICD-10-CM | POA: Diagnosis not present

## 2022-06-16 DIAGNOSIS — F17210 Nicotine dependence, cigarettes, uncomplicated: Secondary | ICD-10-CM | POA: Diagnosis not present

## 2022-06-16 DIAGNOSIS — Z122 Encounter for screening for malignant neoplasm of respiratory organs: Secondary | ICD-10-CM | POA: Diagnosis not present

## 2022-06-16 DIAGNOSIS — Z87891 Personal history of nicotine dependence: Secondary | ICD-10-CM | POA: Diagnosis not present

## 2022-07-06 ENCOUNTER — Ambulatory Visit: Payer: Medicare Other | Admitting: Podiatry

## 2022-07-06 ENCOUNTER — Encounter: Payer: Self-pay | Admitting: Podiatry

## 2022-07-06 VITALS — BP 154/80 | HR 62

## 2022-07-06 DIAGNOSIS — B070 Plantar wart: Secondary | ICD-10-CM | POA: Diagnosis not present

## 2022-07-06 MED ORDER — FLUOROURACIL 5 % EX CREA: TOPICAL_CREAM | Freq: Two times a day (BID) | CUTANEOUS | 0 refills | Status: DC

## 2022-07-06 NOTE — Progress Notes (Signed)
Subjective:  Patient ID: Carl Jimenez, male    DOB: 09-13-1955,  MRN: [id number] HPI Chief Complaint  Patient presents with   Foot Pain    Plantar forefoot left - callused lesion x 3-4 years, thought was a wart, so has been using Compound W-better, but not gone   Diabetes    Last A1c was 6.7   New Patient (Initial Visit)    67 y.o. male presents with the above complaint.   ROS: Denies fever chills nausea vomit muscle aches pains calf pain back pain chest pain shortness of breath.  Past Medical History:  Diagnosis Date   COPD (chronic obstructive pulmonary disease) ([place])    Depression    Diabetes mellitus without complication ([place])    Hypertension    Psoriasis    Past Surgical History:  Procedure Laterality Date   BIOPSY  06/15/2021   Procedure: BIOPSY;  Surgeon: Harvel Quale, MD;  [facility];  Service: Gastroenterology;;  gastric, distal, esophageal, mid-esophageal   BIOPSY  08/16/2021   Procedure: BIOPSY;  Surgeon: Harvel Quale, MD;  [facility];  Service: Gastroenterology;;   CATARACT EXTRACTION W/PHACO Left 07/29/2021   Procedure: CATARACT EXTRACTION PHACO AND INTRAOCULAR LENS PLACEMENT ([place]);  Surgeon: Baruch Goldmann, MD;  Location: [facility];  Service: Ophthalmology;  Laterality: Left;  CDE 5.89   CATARACT EXTRACTION W/PHACO Right 08/12/2021   Procedure: CATARACT EXTRACTION PHACO AND INTRAOCULAR LENS PLACEMENT (I[facility]);  Surgeon: Baruch Goldmann, MD;  Location: [facility];  Service: Ophthalmology;  Laterality: Right;  CDE: 6.69   ESOPHAGOGASTRODUODENOSCOPY (EGD) WITH PROPOFOL N/A 06/15/2021   Procedure: ESOPHAGOGASTRODUODENOSCOPY (EGD) WITH PROPOFOL;  Surgeon: Harvel Quale, MD;  [facility];  Service: Gastroenterology;  Laterality: N/A;   ESOPHAGOGASTRODUODENOSCOPY (EGD) WITH PROPOFOL N/A 08/16/2021   Procedure: ESOPHAGOGASTRODUODENOSCOPY (EGD) WITH PROPOFOL;  Surgeon: Harvel Quale, MD;  Location: [facility];  Service: Gastroenterology;  Laterality: N/A;  905   HERNIA REPAIR      Current Outpatient Medications:    losartan (COZAAR) 25 MG tablet, Take 25 mg by mouth daily., Disp: , Rfl:    albuterol (VENTOLIN HFA) 108 (90 Base) MCG/ACT inhaler, Inhale 2 puffs into the lungs every 6 (six) hours as needed for wheezing or shortness of breath., Disp: , Rfl:    citalopram (CELEXA) 20 MG tablet, Take 20 mg by mouth daily., Disp: , Rfl:    clobetasol cream (TEMOVATE) 4.09 %, Apply 1 application topically daily as needed (irritation)., Disp: , Rfl:    Coenzyme Q10 (COQ10) 50 MG CAPS, Take 50 mg by mouth daily., Disp: , Rfl:    empagliflozin (JARDIANCE) 10 MG TABS tablet, Take 10 mg by mouth daily., Disp: , Rfl:    fluorouracil (EFUDEX) 5 % cream, Apply topically 2 (two) times daily., Disp: 40 g, Rfl: 0   metFORMIN (GLUCOPHAGE-XR) 500 MG 24 hr tablet, Take 500 mg by mouth 2 (two) times daily., Disp: , Rfl:    Multiple Vitamin (MULTIVITAMIN) tablet, Take 1 tablet by mouth daily., Disp: , Rfl:    omeprazole (PRILOSEC) 40 MG capsule, Take 1 capsule (40 mg total) by mouth daily., Disp: 90 capsule, Rfl: 3   rosuvastatin (CRESTOR) 20 MG tablet, Take 20 mg by mouth at bedtime., Disp: , Rfl:   No Known Allergies Review of Systems Objective:   Vitals:   07/06/22 0840  BP: (!) 154/80  Pulse: 62    General: Well developed, nourished, in no acute distress, alert and oriented x3  Dermatological: Skin is warm, dry and supple bilateral. Nails x 10 are well maintained; remaining integument appears unremarkable at this time. There are no open sores, no preulcerative lesions, no rash or signs of infection present.  He has a small verrucoid lesion measuring less than a centimeter in diameter beneath the third metatarsal head of the left foot.  It is smooth the skin lines circumvent the lesion.  This either could be a verrucoid lesion or could be a dermatofibroma.  He has been utilizing Compound W which has  resulted in the epithelization of the epidermis.  Thrombosed capillaries are not visible.  Vascular: Dorsalis Pedis artery and Posterior Tibial artery pedal pulses are 2/4 bilateral with immedate capillary fill time. Pedal hair growth present. No varicosities and no lower extremity edema present bilateral.   Neruologic: Grossly intact via light touch bilateral. Vibratory intact via tuning fork bilateral. Protective threshold with Semmes Wienstein monofilament intact to all pedal sites bilateral. Patellar and Achilles deep tendon reflexes 2+ bilateral. No Babinski or clonus noted bilateral.   Musculoskeletal: No gross boney pedal deformities bilateral. No pain, crepitus, or limitation noted with foot and ankle range of motion bilateral. Muscular strength 5/5 in all groups tested bilateral.  Gait: Unassisted, Nonantalgic.    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  He did not want to go through that today.  He states he might want to do it later if all else fails.  We are going to start Efudex cream and assumption that this is verrucoid in nature he will apply this twice daily under occlusion follow-up with me in 6 weeks at which time curettage to be performed if there is no improvement.     Bernell Sigal T. Boykins, [place]

## 2022-07-10 DIAGNOSIS — E119 Type 2 diabetes mellitus without complications: Secondary | ICD-10-CM | POA: Diagnosis not present

## 2022-07-10 DIAGNOSIS — Z961 Presence of intraocular lens: Secondary | ICD-10-CM | POA: Diagnosis not present

## 2022-07-10 DIAGNOSIS — Z7984 Long term (current) use of oral hypoglycemic drugs: Secondary | ICD-10-CM | POA: Diagnosis not present

## 2022-07-17 DIAGNOSIS — F32A Depression, unspecified: Secondary | ICD-10-CM | POA: Diagnosis not present

## 2022-07-17 DIAGNOSIS — J449 Chronic obstructive pulmonary disease, unspecified: Secondary | ICD-10-CM | POA: Diagnosis not present

## 2022-07-17 DIAGNOSIS — I10 Essential (primary) hypertension: Secondary | ICD-10-CM | POA: Diagnosis not present

## 2022-07-17 DIAGNOSIS — B070 Plantar wart: Secondary | ICD-10-CM | POA: Diagnosis not present

## 2022-07-17 DIAGNOSIS — I700 Atherosclerosis of aorta: Secondary | ICD-10-CM | POA: Diagnosis not present

## 2022-07-17 DIAGNOSIS — L409 Psoriasis, unspecified: Secondary | ICD-10-CM | POA: Diagnosis not present

## 2022-07-17 DIAGNOSIS — E1169 Type 2 diabetes mellitus with other specified complication: Secondary | ICD-10-CM | POA: Diagnosis not present

## 2022-07-18 ENCOUNTER — Encounter: Payer: Self-pay | Admitting: Internal Medicine

## 2022-07-18 ENCOUNTER — Ambulatory Visit: Payer: Medicare Other | Attending: Internal Medicine | Admitting: Internal Medicine

## 2022-07-18 VITALS — BP 138/70 | HR 75 | Ht 72.0 in | Wt 199.4 lb

## 2022-07-18 DIAGNOSIS — I2510 Atherosclerotic heart disease of native coronary artery without angina pectoris: Secondary | ICD-10-CM | POA: Diagnosis not present

## 2022-07-18 DIAGNOSIS — E7849 Other hyperlipidemia: Secondary | ICD-10-CM

## 2022-07-18 DIAGNOSIS — I10 Essential (primary) hypertension: Secondary | ICD-10-CM

## 2022-07-18 DIAGNOSIS — Z79899 Other long term (current) drug therapy: Secondary | ICD-10-CM

## 2022-07-18 DIAGNOSIS — E785 Hyperlipidemia, unspecified: Secondary | ICD-10-CM | POA: Insufficient documentation

## 2022-07-18 MED ORDER — METOPROLOL TARTRATE 100 MG PO TABS: 100.0000 mg | ORAL_TABLET | Freq: Once | ORAL | 0 refills | Status: DC

## 2022-07-18 NOTE — Progress Notes (Signed)
Cardiology Office Note  Date: 07/18/2022   ID: Carl Jimenez, DOB 1955-08-31, MRN [id number]  PCP:  Christian Nation, MD  Cardiologist:  Chalmers Guest, MD Electrophysiologist:  None   Reason for Office Visit: Moderate coronary artery calcifications on CT chest   History of Present Illness: Carl Jimenez is a 67 y.o. male known to have HTN, DM 2, COPD was referred to cardiology clinic for evaluation of incidental finding of moderate coronary artery calcifications on CT chest lung cancer screening.  Patient reported having once in a while pain in the left side of his chest lasting for the whole day. Last episode of chest pain was many months ago. During interview, he had a recurrence of chest pain and was able to talk through it. Denies any DOE (but has baseline SOB from COPD but no recent worsening), dizziness, lightness, syncope, palpitations, leg swelling and claudication. Former smoker, denies alcohol use and illicit drug abuse. No prior history of MI/PCI/CABG. He underwent a CT chest as a part of lung cancer screening and showed moderate three-vessel coronary artery calcifications.   Past Medical History:  Diagnosis Date   COPD (chronic obstructive pulmonary disease) ([place])    Depression    Diabetes mellitus without complication ([place])    Hypertension    Psoriasis     Past Surgical History:  Procedure Laterality Date   BIOPSY  06/15/2021   Procedure: BIOPSY;  Surgeon: Harvel Quale, MD;  [facility];  Service: Gastroenterology;;  gastric, distal, esophageal, mid-esophageal   BIOPSY  08/16/2021   Procedure: BIOPSY;  Surgeon: Harvel Quale, MD;  [facility];  Service: Gastroenterology;;   CATARACT EXTRACTION W/PHACO Left 07/29/2021   Procedure: CATARACT EXTRACTION PHACO AND INTRAOCULAR LENS PLACEMENT ([place]);  Surgeon: Baruch Goldmann, MD;  Location: [facility];  Service: Ophthalmology;  Laterality: Left;  CDE 5.89   CATARACT  EXTRACTION W/PHACO Right 08/12/2021   Procedure: CATARACT EXTRACTION PHACO AND INTRAOCULAR LENS PLACEMENT ([facility]);  Surgeon: Baruch Goldmann, MD;  Location: [facility];  Service: Ophthalmology;  Laterality: Right;  CDE: 6.69   ESOPHAGOGASTRODUODENOSCOPY (EGD) WITH PROPOFOL N/A 06/15/2021   Procedure: ESOPHAGOGASTRODUODENOSCOPY (EGD) WITH PROPOFOL;  Surgeon: Harvel Quale, MD;  [facility];  Service: Gastroenterology;  Laterality: N/A;   ESOPHAGOGASTRODUODENOSCOPY (EGD) WITH PROPOFOL N/A 08/16/2021   Procedure: ESOPHAGOGASTRODUODENOSCOPY (EGD) WITH PROPOFOL;  Surgeon: Harvel Quale, MD;  [facility];  Service: Gastroenterology;  Laterality: N/A;  905   HERNIA REPAIR      Current Outpatient Medications  Medication Sig Dispense Refill   albuterol (VENTOLIN HFA) 108 (90 Base) MCG/ACT inhaler Inhale 2 puffs into the lungs every 6 (six) hours as needed for wheezing or shortness of breath.     citalopram (CELEXA) 20 MG tablet Take 20 mg by mouth daily.     clobetasol cream (TEMOVATE) 7.56 % Apply 1 application topically daily as needed (irritation).     Coenzyme Q10 (COQ10) 50 MG CAPS Take 50 mg by mouth daily.     empagliflozin (JARDIANCE) 10 MG TABS tablet Take 10 mg by mouth daily.     fluorouracil (EFUDEX) 5 % cream Apply topically 2 (two) times daily. 40 g 0   losartan (COZAAR) 25 MG tablet Take 25 mg by mouth daily.     metFORMIN (GLUCOPHAGE-XR) 500 MG 24 hr tablet Take 500 mg by mouth 2 (two) times daily.     metoprolol tartrate (LOPRESSOR) 100 MG tablet Take 1 tablet (100 mg  total) by mouth once for 1 dose. 2 hours before CT scan 1 tablet 0   Multiple Vitamin (MULTIVITAMIN) tablet Take 1 tablet by mouth daily.     omeprazole (PRILOSEC) 40 MG capsule Take 1 capsule (40 mg total) by mouth daily. 90 capsule 3   rosuvastatin (CRESTOR) 20 MG tablet Take 20 mg by mouth at bedtime.     SPIRIVA HANDIHALER 18 MCG inhalation capsule 1 capsule daily.     No  current facility-administered medications for this visit.   Allergies:  Patient has no known allergies.   Social History: The patient  reports that he has never smoked. He has never used smokeless tobacco. He reports that he does not drink alcohol and does not use drugs.   Family History: The patient's family history is not on file.   ROS:  Please see the history of present illness. Otherwise, complete review of systems is positive for none.  All other systems are reviewed and negative.   Physical Exam: VS:  BP 138/70   Pulse 75   Ht 6' (1.829 m)   Wt 199 lb 6.4 oz (90.4 kg)   SpO2 96%   BMI 27.04 kg/m , BMI Body mass index is 27.04 kg/m.  Wt Readings from Last 3 Encounters:  07/18/22 199 lb 6.4 oz (90.4 kg)  06/01/22 196 lb (88.9 kg)  08/08/21 200 lb 3.2 oz (90.8 kg)    General: Patient appears comfortable at rest. HEENT: Conjunctiva and lids normal, oropharynx clear with moist mucosa. Neck: Supple, no elevated JVP or carotid bruits, no thyromegaly. Lungs: Clear to auscultation, nonlabored breathing at rest. Cardiac: Regular rate and rhythm, no S3 or significant systolic murmur, no pericardial rub. Abdomen: Soft, nontender, no hepatomegaly, bowel sounds present, no guarding or rebound. Extremities: No pitting edema, distal pulses 2+. Skin: Warm and dry. Musculoskeletal: No kyphosis. Neuropsychiatric: Alert and oriented x3, affect grossly appropriate.  ECG:  An ECG dated 07/18/2022 was personally reviewed today and demonstrated:  Normal sinus rhythm and no ST changes  Recent Labwork: 06/01/2022: BUN 15; Creatinine, Ser 0.87; Hemoglobin 14.5; Platelets 226; Potassium 3.7; Sodium 140  No results found for: "CHOL", "TRIG", "HDL", "CHOLHDL", "VLDL", "LDLCALC", "LDLDIRECT"  Other Studies Reviewed Today:   Assessment and Plan: Patient is a 67 year old M known to have HTN, DM 2, COPD, HLD was referred to cardiology clinic for imaging evidence of moderate three-vessel coronary  artery calcifications.  # Moderate three-vessel coronary artery calcifications -Patient had incidental finding of moderate three-vessel coronary artery calcifications on CT chest (for lung cancer screening). He also has atypical chest pains lasting for the whole day and frequency has been once in a while. Obtain CTA cardiac.  # HTN, partially controlled -Continue losartan 25 mg once daily -Continue Jardiance 10 mg once daily -Management of HTN per PCP  # HLD -Continue rosuvastatin 20 mg nightly. Goal LDL<100.  I have spent a total of 45 minutes with patient reviewing chart, EKGs, labs and examining patient as well as establishing an assessment and plan that was discussed with the patient.  > 50% of time was spent in direct patient care.     Medication Adjustments/Labs and Tests Ordered: Current medicines are reviewed at length with the patient today.  Concerns regarding medicines are outlined above.   Tests Ordered: Orders Placed This Encounter  Procedures   CT CORONARY MORPH W/CTA COR W/SCORE W/CA W/CM &/OR WO/CM   Basic metabolic panel   EKG 12-Lead    Medication Changes: Meds ordered this  encounter  Medications   metoprolol tartrate (LOPRESSOR) 100 MG tablet    Sig: Take 1 tablet (100 mg total) by mouth once for 1 dose. 2 hours before CT scan    Dispense:  1 tablet    Refill:  0    Disposition:  Follow up  1 year  Signed Lateef Juncaj Fidel Levy, MD, 07/18/2022 1:55 PM    [facility] [place], [place], [place] [address]

## 2022-07-18 NOTE — Patient Instructions (Addendum)
Medication Instructions:  Your physician recommends that you continue on your current medications as directed. Please refer to the Current Medication list given to you today.  Labwork: BMET 1-2 weeks before CT scan Non-fasting [facility]  Testing/Procedures: Coronary CTA-see instructions below  Follow-Up: Your physician recommends that you schedule a follow-up appointment in: 1 year. You will receive a reminder call in the mail in about 10 months reminding you to call and schedule your appointment. If you don't receive this call, please contact our office.  Any Other Special Instructions Will Be Listed Below (If Applicable).  If you need a refill on your cardiac medications before your next appointment, please call your pharmacy.    Your cardiac CT will be scheduled at one of the below locations:   [facility] [address] [place], [place] [address] [phone]  If scheduled at [facility], please arrive at the [facility] Entr[facility] (Entrance C2) of [facility] 30 minutes prior to test start time. You can use the FREE valet parking offered at entrance C (encouraged to control the heart rate for the test)  Proceed to the [facility] Radiology Department (first floor) to check-in and test prep.  All radiology patients and guests should use entrance C2 at [facility], accessed from [facility], even though the hospital's physical address listed is [address]    Please follow these instructions carefully (unless otherwise directed):  Hold all erectile dysfunction medications at least 3 days (72 hrs) prior to test. (Ie viagra, cialis, sildenafil, tadalafil, etc) We will administer nitroglycerin during this exam.   On the Night Before the Test: Be sure to Drink plenty of water. Do not consume any caffeinated/decaffeinated beverages or chocolate 12 hours prior to your test. Do not take any antihistamines 12  hours prior to your test. If the patient has contrast allergy: no contrast allergy  On the Day of the Test: Drink plenty of water until 1 hour prior to the test. Do not eat any food 1 hour prior to test. You may take your regular medications prior to the test except metformin and jardiance  Take metoprolol 100 mg (Lopressor) two hours prior to test.      After the Test: Drink plenty of water. After receiving IV contrast, you may experience a mild flushed feeling. This is normal. On occasion, you may experience a mild rash up to 24 hours after the test. This is not dangerous. If this occurs, you can take Benadryl 25 mg and increase your fluid intake. If you experience trouble breathing, this can be serious. If it is severe call 911 IMMEDIATELY. If it is mild, please call our office. If you take any of these medications: Metformin please do not take 48 hours after completing test unless otherwise instructed.  We will call to schedule your test 2-4 weeks out understanding that some insurance companies will need an authorization prior to the service being performed.   For non-scheduling related questions, please contact the cardiac imaging nurse navigator should you have any questions/concerns: Marchia Bond, Cardiac Imaging Nurse Navigator Gordy Clement, Cardiac Imaging Nurse Navigator [facility] Direct Office Dial: [phone]   For scheduling needs, including cancellations and rescheduling, please call [place], [phone].

## 2022-07-19 DIAGNOSIS — I2510 Atherosclerotic heart disease of native coronary artery without angina pectoris: Secondary | ICD-10-CM | POA: Diagnosis not present

## 2022-07-19 DIAGNOSIS — Z79899 Other long term (current) drug therapy: Secondary | ICD-10-CM | POA: Diagnosis not present

## 2022-07-20 LAB — BASIC METABOLIC PANEL
BUN/Creatinine Ratio: 16 (ref 10–24)
BUN: 15 mg/dL (ref 8–27)
CO2: 22 mmol/L (ref 20–29)
Calcium: 9.8 mg/dL (ref 8.6–10.2)
Chloride: 108 mmol/L — ABNORMAL HIGH (ref 96–106)
Creatinine, Ser: 0.92 mg/dL (ref 0.76–1.27)
Glucose: 125 mg/dL — ABNORMAL HIGH (ref 70–99)
Potassium: 4 mmol/L (ref 3.5–5.2)
Sodium: 142 mmol/L (ref 134–144)
eGFR: 91 mL/min/{1.73_m2} (ref >59)

## 2022-07-21 ENCOUNTER — Telehealth (HOSPITAL_COMMUNITY): Payer: Self-pay | Admitting: Emergency Medicine

## 2022-07-21 NOTE — Telephone Encounter (Signed)
Reaching out to patient to offer assistance regarding upcoming cardiac imaging study; pt verbalizes understanding of appt date/time, parking situation and where to check in, pre-test NPO status and medications ordered, and verified current allergies; name and call back number provided for further questions should they arise Carl Bond RN Navigator Cardiac Imaging [facility] [phone] office [phone] cell  Arrival 800 WC entrance 100mg  metoprolol tartrate Denies iv issues  Aware contrast/nitro

## 2022-07-24 ENCOUNTER — Other Ambulatory Visit: Payer: Self-pay | Admitting: Cardiology

## 2022-07-24 ENCOUNTER — Telehealth: Payer: Self-pay | Admitting: *Deleted

## 2022-07-24 ENCOUNTER — Ambulatory Visit (HOSPITAL_COMMUNITY)
Admission: RE | Admit: 2022-07-24 | Discharge: 2022-07-24 | Disposition: A | Discharge status: Home / Self Care | Payer: Medicare Other | Source: Ambulatory Visit | Attending: Internal Medicine | Admitting: Internal Medicine

## 2022-07-24 DIAGNOSIS — R931 Abnormal findings on diagnostic imaging of heart and coronary circulation: Secondary | ICD-10-CM | POA: Insufficient documentation

## 2022-07-24 DIAGNOSIS — I2510 Atherosclerotic heart disease of native coronary artery without angina pectoris: Secondary | ICD-10-CM | POA: Diagnosis not present

## 2022-07-24 MED ORDER — IOHEXOL 350 MG/ML SOLN
100.0000 mL | Freq: Once | INTRAVENOUS | Status: AC | PRN
  Administered 2022-07-24: 100 mL via INTRAVENOUS

## 2022-07-24 MED ORDER — NITROGLYCERIN 0.4 MG SL SUBL
SUBLINGUAL_TABLET | SUBLINGUAL | Status: AC
  Filled 2022-07-24: qty 2

## 2022-07-24 MED ORDER — METOPROLOL TARTRATE 25 MG PO TABS: 25.0000 mg | ORAL_TABLET | Freq: Two times a day (BID) | ORAL | 3 refills | Status: DC

## 2022-07-24 MED ORDER — NITROGLYCERIN 0.4 MG SL SUBL
0.8000 mg | SUBLINGUAL_TABLET | Freq: Once | SUBLINGUAL | Status: AC
  Administered 2022-07-24: 0.8 mg via SUBLINGUAL

## 2022-07-24 NOTE — Telephone Encounter (Signed)
-----  Message from Chalmers Guest, MD sent at 07/24/2022  1:37 PM EST ----- Abnormal CT cardiac. Severe stenosis of left circumflex vessel.  Patient symptomatic. Please schedule a follow-up visit to see me in 1 to 2 weeks to schedule for LHC. Start him on metoprolol tartrate 25 mg twice daily.

## 2022-07-24 NOTE — Telephone Encounter (Signed)
Patient informed and verbalized understanding of plan. Copy sent to PCP 

## 2022-07-25 ENCOUNTER — Ambulatory Visit (HOSPITAL_BASED_OUTPATIENT_CLINIC_OR_DEPARTMENT_OTHER)
Admission: RE | Admit: 2022-07-25 | Discharge: 2022-07-25 | Disposition: A | Discharge status: Home / Self Care | Payer: Medicare Other | Source: Ambulatory Visit | Attending: Cardiology | Admitting: Cardiology

## 2022-07-25 DIAGNOSIS — I2510 Atherosclerotic heart disease of native coronary artery without angina pectoris: Secondary | ICD-10-CM | POA: Diagnosis not present

## 2022-07-25 DIAGNOSIS — R931 Abnormal findings on diagnostic imaging of heart and coronary circulation: Secondary | ICD-10-CM

## 2022-08-02 ENCOUNTER — Other Ambulatory Visit (HOSPITAL_COMMUNITY)
Admission: RE | Admit: 2022-08-02 | Discharge: 2022-08-02 | Disposition: A | Discharge status: Home / Self Care | Payer: Medicare Other | Source: Ambulatory Visit | Attending: Internal Medicine | Admitting: Internal Medicine

## 2022-08-02 ENCOUNTER — Encounter: Payer: Self-pay | Admitting: Internal Medicine

## 2022-08-02 ENCOUNTER — Ambulatory Visit: Payer: Medicare Other | Attending: Internal Medicine | Admitting: Internal Medicine

## 2022-08-02 VITALS — BP 122/84 | HR 49 | Ht 72.0 in | Wt 201.0 lb

## 2022-08-02 DIAGNOSIS — Z01810 Encounter for preprocedural cardiovascular examination: Secondary | ICD-10-CM | POA: Diagnosis not present

## 2022-08-02 LAB — CBC
HCT: 41.9 % (ref 39.0–52.0)
Hemoglobin: 13.9 g/dL (ref 13.0–17.0)
MCH: 29 pg (ref 26.0–34.0)
MCHC: 33.2 g/dL (ref 30.0–36.0)
MCV: 87.3 fL (ref 80.0–100.0)
Platelets: 214 10*3/uL (ref 150–400)
RBC: 4.8 MIL/uL (ref 4.22–5.81)
RDW: 13.4 % (ref 11.5–15.5)
WBC: 7 10*3/uL (ref 4.0–10.5)
nRBC: 0 % (ref 0.0–0.2)

## 2022-08-02 NOTE — Progress Notes (Addendum)
Cardiology Office Note  Date: 08/02/2022   ID: [name], DOB 1955/08/05, MRN [id number]  PCP:  [place] Nation, MD  Cardiologist:  Chalmers Guest, MD Electrophysiologist:  None   Reason for Office Visit: Follow-up of CCTA findings   History of Present Illness: Carl Jimenez is a 67 y.o. male known to have HTN, DM 2, COPD presented to cardiology clinic for follow-up visit and to discuss his CTA findings.   Patient was initially referred to cardiology clinic for evaluation of incidental finding of moderate coronary artery calcifications on CT chest lung cancer screening.  Patient underwent CCTA that showed focal stenosis of distal LCx, FFR 0.66.  He is here for follow-up visit.  He reported having decrease in stamina levels for the last 1 year, has DOE with strenuous activities and also substernal resting chest pains lasting for 15 to 20 minutes occurring 3 times per week. Denies any chest pain with exertion.  Denies any claudication.  Former smoker, quit 5 years ago.  Patient has symptoms of insomnia, fatigue and morning naps and will benefit from OSA screening.  Past Medical History:  Diagnosis Date   COPD (chronic obstructive pulmonary disease) ([place])    Depression    Diabetes mellitus without complication ([place])    Hypertension    Psoriasis     Past Surgical History:  Procedure Laterality Date   BIOPSY  06/15/2021   Procedure: BIOPSY;  Surgeon: Harvel Quale, MD;  [facility];  Service: Gastroenterology;;  gastric, distal, esophageal, mid-esophageal   BIOPSY  08/16/2021   Procedure: BIOPSY;  Surgeon: Harvel Quale, MD;  [facility];  Service: Gastroenterology;;   CATARACT EXTRACTION W/PHACO Left 07/29/2021   Procedure: CATARACT EXTRACTION PHACO AND INTRAOCULAR LENS PLACEMENT ([place]);  Surgeon: Baruch Goldmann, MD;  Location: [facility];  Service: Ophthalmology;  Laterality: Left;  CDE 5.89   CATARACT EXTRACTION W/PHACO Right  08/12/2021   Procedure: CATARACT EXTRACTION PHACO AND INTRAOCULAR LENS PLACEMENT ([facility]);  Surgeon: Baruch Goldmann, MD;  Location: [facility];  Service: Ophthalmology;  Laterality: Right;  CDE: 6.69   ESOPHAGOGASTRODUODENOSCOPY (EGD) WITH PROPOFOL N/A 06/15/2021   Procedure: ESOPHAGOGASTRODUODENOSCOPY (EGD) WITH PROPOFOL;  Surgeon: Harvel Quale, MD;  [facility];  Service: Gastroenterology;  Laterality: N/A;   ESOPHAGOGASTRODUODENOSCOPY (EGD) WITH PROPOFOL N/A 08/16/2021   Procedure: ESOPHAGOGASTRODUODENOSCOPY (EGD) WITH PROPOFOL;  Surgeon: Harvel Quale, MD;  [facility];  Service: Gastroenterology;  Laterality: N/A;  905   HERNIA REPAIR      Current Outpatient Medications  Medication Sig Dispense Refill   albuterol (VENTOLIN HFA) 108 (90 Base) MCG/ACT inhaler Inhale 2 puffs into the lungs every 6 (six) hours as needed for wheezing or shortness of breath.     citalopram (CELEXA) 20 MG tablet Take 20 mg by mouth daily.     Coenzyme Q10 (COQ10) 50 MG CAPS Take 50 mg by mouth daily.     empagliflozin (JARDIANCE) 10 MG TABS tablet Take 10 mg by mouth daily.     losartan (COZAAR) 50 MG tablet Take 50 mg by mouth daily.     metFORMIN (GLUCOPHAGE-XR) 500 MG 24 hr tablet Take 500 mg by mouth 2 (two) times daily.     metoprolol tartrate (LOPRESSOR) 25 MG tablet Take 1 tablet (25 mg total) by mouth 2 (two) times daily. 60 tablet 3   Multiple Vitamin (MULTIVITAMIN) tablet Take 1 tablet by mouth daily.     omeprazole (PRILOSEC) 40 MG capsule Take  1 capsule (40 mg total) by mouth daily. 90 capsule 3   rosuvastatin (CRESTOR) 20 MG tablet Take 20 mg by mouth at bedtime.     SPIRIVA HANDIHALER 18 MCG inhalation capsule 1 capsule daily.     TURMERIC PO Take by mouth daily.     No current facility-administered medications for this visit.   Allergies:  Patient has no known allergies.   Social History: The patient  reports that he has never smoked. He has never used  smokeless tobacco. He reports that he does not drink alcohol and does not use drugs.   Family History: The patient's family history is not on file.   ROS:  Please see the history of present illness. Otherwise, complete review of systems is positive for none.  All other systems are reviewed and negative.   Physical Exam: VS:  BP 122/84   Pulse (!) 49   Ht 6' (1.829 m)   Wt 201 lb (91.2 kg)   BMI 27.26 kg/m , BMI Body mass index is 27.26 kg/m.  Wt Readings from Last 3 Encounters:  08/02/22 201 lb (91.2 kg)  07/18/22 199 lb 6.4 oz (90.4 kg)  06/01/22 196 lb (88.9 kg)    General: Patient appears comfortable at rest. HEENT: Conjunctiva and lids normal, oropharynx clear with moist mucosa. Neck: Supple, no elevated JVP or carotid bruits, no thyromegaly. Lungs: Clear to auscultation, nonlabored breathing at rest. Cardiac: Regular rate and rhythm, no S3 or significant systolic murmur, no pericardial rub. Abdomen: Soft, nontender, no hepatomegaly, bowel sounds present, no guarding or rebound. Extremities: No pitting edema, distal pulses 2+. Skin: Warm and dry. Musculoskeletal: No kyphosis. Neuropsychiatric: Alert and oriented x3, affect grossly appropriate.  ECG:  An ECG dated 07/18/2022 was personally reviewed today and demonstrated:  Normal sinus rhythm and no ST changes  Recent Labwork: 07/19/2022: BUN 15; Creatinine, Ser 0.92; Potassium 4.0; Sodium 142 08/02/2022: Hemoglobin 13.9; Platelets 214  No results found for: "CHOL", "TRIG", "HDL", "CHOLHDL", "VLDL", "LDLCALC", "LDLDIRECT"  Other Studies Reviewed Today:   Assessment and Plan: Patient is a 67 year old M known to have HTN, DM 2, COPD, HLD was referred to cardiology clinic for imaging evidence of moderate three-vessel coronary artery calcifications.  # Moderate three-vessel coronary artery calcifications # Focal stenosis of distal LCx, flow-limiting by CCTA -Patient had incidental finding of moderate three-vessel coronary  artery calcifications on CT chest (for lung cancer screening) due to which he was referred to cardiology clinic for further evaluation. Patient reported decrease in stamina levels in the last 1 year, DOE with strenuous activities and substernal resting chest pains lasting for 15 to 20 minutes but denies any chest pain with exertion. Patient has atypical symptoms of CAD and positive CCTA findings of focal stenosis in the distal Lcx, flow-limiting, he will benefit from invasive ischemia evaluation with LHC. Risks and benefits of cardiac catheterization have been discussed with the patient. These include bleeding, infection, kidney damage, stroke, heart attack, death. The patient understands these risks and is willing to proceed. -Patient is scheduled to undergo LHC with Dr. Peter Martinique on 08/08/2022 at 7:30 AM.  No contrast allergy and normal serum creatinine.  # HTN, partially controlled -Continue metoprolol tartrate 20 mg twice daily -Continue losartan 50 mg once daily -Management of HTN per PCP  # HLD -Continue rosuvastatin 20 mg nightly.  Goal LDL less than 70.  # OSA screening -Patient has symptoms of insomnia, fatigue and morning naps and he will benefit from OSA screening with WatchPAT.  I have spent a total of 33 minutes with patient reviewing chart, EKGs, labs and examining patient as well as establishing an assessment and plan that was discussed with the patient.  > 50% of time was spent in direct patient care.     Medication Adjustments/Labs and Tests Ordered: Current medicines are reviewed at length with the patient today.  Concerns regarding medicines are outlined above.   Tests Ordered: Orders Placed This Encounter  Procedures   CBC    Medication Changes: No orders of the defined types were placed in this encounter.   Disposition:  Follow up  1 month post Trion, MD, 08/02/2022 12:35 PM    [facility], [place], [place] [address]

## 2022-08-02 NOTE — Patient Instructions (Signed)
Medication Instructions:  Your physician recommends that you continue on your current medications as directed. Please refer to the Current Medication list given to you today.   Labwork: CBC- Today  Testing/Procedures: Left Heart Cath  Follow-Up: Follow up with Dr. Dellia Cloud in 1 month  Any Other Special Instructions Will Be Listed Below (If Applicable).     If you need a refill on your cardiac medications before your next appointment, please call your pharmacy.    [facility] [id number] [place] [address] Dept: [phone] Loc: [place]  08/02/2022  You are scheduled for a Cardiac Catheterization on Tuesday, February 13 with Dr. Peter Martinique.  1. Please arrive at the [facility] (Main Entrance A) at [facility]: [address] [place], [place] [address] at 5:30 AM (This time is two hours before your procedure to ensure your preparation). Free valet parking service is available.   Special note: Every effort is made to have your procedure done on time. Please understand that emergencies sometimes delay scheduled procedures.  2. Diet: Do not eat solid foods after midnight.  The patient may have clear liquids until 5am upon the day of the procedure.  3. Labs: You will need to have blood drawn on Today at [facility].   4. Medication instructions in preparation for your procedure:   Contrast Allergy: No      Do not take Diabetes Med Glucophage (Metformin) on the day of the procedure and HOLD 48 HOURS AFTER THE PROCEDURE.  On the morning of your procedure, take your Aspirin 81 mg and any morning medicines NOT listed above.  You may use sips of water.  5. Plan for one night stay--bring personal belongings. 6. Bring a current list of your medications and current insurance cards. 7. You MUST have a responsible person to drive you  home. 8. Someone MUST be with you the first 24 hours after you arrive home or your discharge will be delayed. 9. Please wear clothes that are easy to get on and off and wear slip-on shoes.  Thank you for allowing [place] to care for you!   -- [facility]

## 2022-08-02 NOTE — H&P (View-Only) (Signed)
Cardiology Office Note  Date: 08/02/2022   ID: [name], DOB 1955/08/05, MRN [id number]  PCP:  [place] Nation, MD  Cardiologist:  Chalmers Guest, MD Electrophysiologist:  None   Reason for Office Visit: Follow-up of CCTA findings   History of Present Illness: Jaco Treas is a 67 y.o. male known to have HTN, DM 2, COPD presented to cardiology clinic for follow-up visit and to discuss his CTA findings.   Patient was initially referred to cardiology clinic for evaluation of incidental finding of moderate coronary artery calcifications on CT chest lung cancer screening.  Patient underwent CCTA that showed focal stenosis of distal LCx, FFR 0.66.  He is here for follow-up visit.  He reported having decrease in stamina levels for the last 1 year, has DOE with strenuous activities and also substernal resting chest pains lasting for 15 to 20 minutes occurring 3 times per week. Denies any chest pain with exertion.  Denies any claudication.  Former smoker, quit 5 years ago.  Patient has symptoms of insomnia, fatigue and morning naps and will benefit from OSA screening.  Past Medical History:  Diagnosis Date   COPD (chronic obstructive pulmonary disease) ([place])    Depression    Diabetes mellitus without complication ([place])    Hypertension    Psoriasis     Past Surgical History:  Procedure Laterality Date   BIOPSY  06/15/2021   Procedure: BIOPSY;  Surgeon: Harvel Quale, MD;  [facility];  Service: Gastroenterology;;  gastric, distal, esophageal, mid-esophageal   BIOPSY  08/16/2021   Procedure: BIOPSY;  Surgeon: Harvel Quale, MD;  [facility];  Service: Gastroenterology;;   CATARACT EXTRACTION W/PHACO Left 07/29/2021   Procedure: CATARACT EXTRACTION PHACO AND INTRAOCULAR LENS PLACEMENT ([place]);  Surgeon: Baruch Goldmann, MD;  Location: [facility];  Service: Ophthalmology;  Laterality: Left;  CDE 5.89   CATARACT EXTRACTION W/PHACO Right  08/12/2021   Procedure: CATARACT EXTRACTION PHACO AND INTRAOCULAR LENS PLACEMENT ([facility]);  Surgeon: Baruch Goldmann, MD;  Location: [facility];  Service: Ophthalmology;  Laterality: Right;  CDE: 6.69   ESOPHAGOGASTRODUODENOSCOPY (EGD) WITH PROPOFOL N/A 06/15/2021   Procedure: ESOPHAGOGASTRODUODENOSCOPY (EGD) WITH PROPOFOL;  Surgeon: Harvel Quale, MD;  [facility];  Service: Gastroenterology;  Laterality: N/A;   ESOPHAGOGASTRODUODENOSCOPY (EGD) WITH PROPOFOL N/A 08/16/2021   Procedure: ESOPHAGOGASTRODUODENOSCOPY (EGD) WITH PROPOFOL;  Surgeon: Harvel Quale, MD;  [facility];  Service: Gastroenterology;  Laterality: N/A;  905   HERNIA REPAIR      Current Outpatient Medications  Medication Sig Dispense Refill   albuterol (VENTOLIN HFA) 108 (90 Base) MCG/ACT inhaler Inhale 2 puffs into the lungs every 6 (six) hours as needed for wheezing or shortness of breath.     citalopram (CELEXA) 20 MG tablet Take 20 mg by mouth daily.     Coenzyme Q10 (COQ10) 50 MG CAPS Take 50 mg by mouth daily.     empagliflozin (JARDIANCE) 10 MG TABS tablet Take 10 mg by mouth daily.     losartan (COZAAR) 50 MG tablet Take 50 mg by mouth daily.     metFORMIN (GLUCOPHAGE-XR) 500 MG 24 hr tablet Take 500 mg by mouth 2 (two) times daily.     metoprolol tartrate (LOPRESSOR) 25 MG tablet Take 1 tablet (25 mg total) by mouth 2 (two) times daily. 60 tablet 3   Multiple Vitamin (MULTIVITAMIN) tablet Take 1 tablet by mouth daily.     omeprazole (PRILOSEC) 40 MG capsule Take  1 capsule (40 mg total) by mouth daily. 90 capsule 3   rosuvastatin (CRESTOR) 20 MG tablet Take 20 mg by mouth at bedtime.     SPIRIVA HANDIHALER 18 MCG inhalation capsule 1 capsule daily.     TURMERIC PO Take by mouth daily.     No current facility-administered medications for this visit.   Allergies:  Patient has no known allergies.   Social History: The patient  reports that he has never smoked. He has never used  smokeless tobacco. He reports that he does not drink alcohol and does not use drugs.   Family History: The patient's family history is not on file.   ROS:  Please see the history of present illness. Otherwise, complete review of systems is positive for none.  All other systems are reviewed and negative.   Physical Exam: VS:  BP 122/84   Pulse (!) 49   Ht 6' (1.829 m)   Wt 201 lb (91.2 kg)   BMI 27.26 kg/m , BMI Body mass index is 27.26 kg/m.  Wt Readings from Last 3 Encounters:  08/02/22 201 lb (91.2 kg)  07/18/22 199 lb 6.4 oz (90.4 kg)  06/01/22 196 lb (88.9 kg)    General: Patient appears comfortable at rest. HEENT: Conjunctiva and lids normal, oropharynx clear with moist mucosa. Neck: Supple, no elevated JVP or carotid bruits, no thyromegaly. Lungs: Clear to auscultation, nonlabored breathing at rest. Cardiac: Regular rate and rhythm, no S3 or significant systolic murmur, no pericardial rub. Abdomen: Soft, nontender, no hepatomegaly, bowel sounds present, no guarding or rebound. Extremities: No pitting edema, distal pulses 2+. Skin: Warm and dry. Musculoskeletal: No kyphosis. Neuropsychiatric: Alert and oriented x3, affect grossly appropriate.  ECG:  An ECG dated 07/18/2022 was personally reviewed today and demonstrated:  Normal sinus rhythm and no ST changes  Recent Labwork: 07/19/2022: BUN 15; Creatinine, Ser 0.92; Potassium 4.0; Sodium 142 08/02/2022: Hemoglobin 13.9; Platelets 214  No results found for: "CHOL", "TRIG", "HDL", "CHOLHDL", "VLDL", "LDLCALC", "LDLDIRECT"  Other Studies Reviewed Today:   Assessment and Plan: Patient is a 67 year old M known to have HTN, DM 2, COPD, HLD was referred to cardiology clinic for imaging evidence of moderate three-vessel coronary artery calcifications.  # Moderate three-vessel coronary artery calcifications # Focal stenosis of distal LCx, flow-limiting by CCTA -Patient had incidental finding of moderate three-vessel coronary  artery calcifications on CT chest (for lung cancer screening) due to which he was referred to cardiology clinic for further evaluation. Patient reported decrease in stamina levels in the last 1 year, DOE with strenuous activities and substernal resting chest pains lasting for 15 to 20 minutes but denies any chest pain with exertion. Patient has atypical symptoms of CAD and positive CCTA findings of focal stenosis in the distal Lcx, flow-limiting, he will benefit from invasive ischemia evaluation with LHC. Risks and benefits of cardiac catheterization have been discussed with the patient. These include bleeding, infection, kidney damage, stroke, heart attack, death. The patient understands these risks and is willing to proceed. -Patient is scheduled to undergo LHC with Dr. Peter Martinique on 08/08/2022 at 7:30 AM.  No contrast allergy and normal serum creatinine.  # HTN, partially controlled -Continue metoprolol tartrate 20 mg twice daily -Continue losartan 50 mg once daily -Management of HTN per PCP  # HLD -Continue rosuvastatin 20 mg nightly.  Goal LDL less than 70.  # OSA screening -Patient has symptoms of insomnia, fatigue and morning naps and he will benefit from OSA screening with WatchPAT.  I have spent a total of 33 minutes with patient reviewing chart, EKGs, labs and examining patient as well as establishing an assessment and plan that was discussed with the patient.  > 50% of time was spent in direct patient care.     Medication Adjustments/Labs and Tests Ordered: Current medicines are reviewed at length with the patient today.  Concerns regarding medicines are outlined above.   Tests Ordered: Orders Placed This Encounter  Procedures   CBC    Medication Changes: No orders of the defined types were placed in this encounter.   Disposition:  Follow up  1 month post Trion, MD, 08/02/2022 12:35 PM    [facility] [place], [place], [place] [address]

## 2022-08-07 ENCOUNTER — Telehealth: Payer: Self-pay | Admitting: *Deleted

## 2022-08-07 NOTE — Telephone Encounter (Addendum)
Cardiac Catheterization scheduled at [facility] for: Tuesday August 08, 2022 7:30 AM Arrival time and place: [place] Entrance A at: 5:30 AM  Nothing to eat after midnight prior to procedure, clear liquids until 5 AM day of procedure.  Medication instructions: -Hold:  Metformin-day of procedure and 48 hours post procedure  Jardiance-pt reports he takes in the evening -Except hold medications usual morning medications can be taken with sips of water including aspirin 81 mg.  Confirmed patient has responsible adult to drive home post procedure and be with patient first 24 hours after arriving home. *  Patient reports no new symptoms concerning for COVID-19 in the past 10 days.  Reviewed procedure instructions with patient.  Patient reports he plans to drive himself to hospital, leave his car, has someone that he will call to drive him home at discharge and be with him after arriving home.

## 2022-08-08 ENCOUNTER — Encounter (HOSPITAL_COMMUNITY)
Admission: RE | Disposition: A | Discharge status: Home / Self Care | Payer: Self-pay | Source: Home / Self Care | Attending: Cardiology

## 2022-08-08 ENCOUNTER — Encounter (HOSPITAL_COMMUNITY): Payer: Self-pay | Admitting: Cardiology

## 2022-08-08 ENCOUNTER — Ambulatory Visit (HOSPITAL_COMMUNITY)
Admission: RE | Admit: 2022-08-08 | Discharge: 2022-08-08 | Disposition: A | Discharge status: Home / Self Care | Payer: Medicare Other | Attending: Cardiology | Admitting: Cardiology

## 2022-08-08 ENCOUNTER — Other Ambulatory Visit: Payer: Self-pay

## 2022-08-08 DIAGNOSIS — E785 Hyperlipidemia, unspecified: Secondary | ICD-10-CM | POA: Insufficient documentation

## 2022-08-08 DIAGNOSIS — Z79899 Other long term (current) drug therapy: Secondary | ICD-10-CM | POA: Insufficient documentation

## 2022-08-08 DIAGNOSIS — Z7984 Long term (current) use of oral hypoglycemic drugs: Secondary | ICD-10-CM | POA: Insufficient documentation

## 2022-08-08 DIAGNOSIS — I10 Essential (primary) hypertension: Secondary | ICD-10-CM | POA: Diagnosis not present

## 2022-08-08 DIAGNOSIS — J449 Chronic obstructive pulmonary disease, unspecified: Secondary | ICD-10-CM | POA: Diagnosis not present

## 2022-08-08 DIAGNOSIS — I25118 Atherosclerotic heart disease of native coronary artery with other forms of angina pectoris: Secondary | ICD-10-CM

## 2022-08-08 DIAGNOSIS — Z87891 Personal history of nicotine dependence: Secondary | ICD-10-CM | POA: Insufficient documentation

## 2022-08-08 DIAGNOSIS — E119 Type 2 diabetes mellitus without complications: Secondary | ICD-10-CM | POA: Diagnosis not present

## 2022-08-08 HISTORY — PX: LEFT HEART CATH AND CORONARY ANGIOGRAPHY: CATH118249

## 2022-08-08 LAB — GLUCOSE, CAPILLARY
Glucose-Capillary: 140 mg/dL — ABNORMAL HIGH (ref 70–99)
Glucose-Capillary: 147 mg/dL — ABNORMAL HIGH (ref 70–99)

## 2022-08-08 SURGERY — LEFT HEART CATH AND CORONARY ANGIOGRAPHY
Anesthesia: LOCAL

## 2022-08-08 MED ORDER — SODIUM CHLORIDE 0.9 % WEIGHT BASED INFUSION
3.0000 mL/kg/h | INTRAVENOUS | Status: AC
  Administered 2022-08-08: 3 mL/kg/h via INTRAVENOUS

## 2022-08-08 MED ORDER — FENTANYL CITRATE (PF) 100 MCG/2ML IJ SOLN
INTRAMUSCULAR | Status: DC | PRN
  Administered 2022-08-08: 25 ug via INTRAVENOUS

## 2022-08-08 MED ORDER — HEPARIN SODIUM (PORCINE) 1000 UNIT/ML IJ SOLN
INTRAMUSCULAR | Status: AC
  Filled 2022-08-08: qty 10

## 2022-08-08 MED ORDER — HEPARIN (PORCINE) IN NACL 1000-0.9 UT/500ML-% IV SOLN
INTRAVENOUS | Status: AC
  Filled 2022-08-08: qty 1000

## 2022-08-08 MED ORDER — SODIUM CHLORIDE 0.9 % IV SOLN: 250.0000 mL | INTRAVENOUS | Status: DC | PRN

## 2022-08-08 MED ORDER — SODIUM CHLORIDE 0.9 % WEIGHT BASED INFUSION: 1.0000 mL/kg/h | INTRAVENOUS | Status: DC

## 2022-08-08 MED ORDER — ONDANSETRON HCL 4 MG/2ML IJ SOLN: 4.0000 mg | Freq: Four times a day (QID) | INTRAMUSCULAR | Status: DC | PRN

## 2022-08-08 MED ORDER — ACETAMINOPHEN 325 MG PO TABS: 650.0000 mg | ORAL_TABLET | ORAL | Status: DC | PRN

## 2022-08-08 MED ORDER — LIDOCAINE HCL (PF) 1 % IJ SOLN
INTRAMUSCULAR | Status: DC | PRN
  Administered 2022-08-08: 5 mL

## 2022-08-08 MED ORDER — HEPARIN (PORCINE) IN NACL 1000-0.9 UT/500ML-% IV SOLN
INTRAVENOUS | Status: DC | PRN
  Administered 2022-08-08 (×2): 500 mL

## 2022-08-08 MED ORDER — MIDAZOLAM HCL 2 MG/2ML IJ SOLN
INTRAMUSCULAR | Status: DC | PRN
  Administered 2022-08-08: 1 mg via INTRAVENOUS

## 2022-08-08 MED ORDER — FENTANYL CITRATE (PF) 100 MCG/2ML IJ SOLN
INTRAMUSCULAR | Status: AC
  Filled 2022-08-08: qty 2

## 2022-08-08 MED ORDER — MIDAZOLAM HCL 2 MG/2ML IJ SOLN
INTRAMUSCULAR | Status: AC
  Filled 2022-08-08: qty 2

## 2022-08-08 MED ORDER — IOHEXOL 350 MG/ML SOLN
INTRAVENOUS | Status: DC | PRN
  Administered 2022-08-08: 45 mL

## 2022-08-08 MED ORDER — VERAPAMIL HCL 2.5 MG/ML IV SOLN
INTRAVENOUS | Status: DC | PRN
  Administered 2022-08-08: 10 mL via INTRA_ARTERIAL

## 2022-08-08 MED ORDER — SODIUM CHLORIDE 0.9% FLUSH: 3.0000 mL | INTRAVENOUS | Status: DC | PRN

## 2022-08-08 MED ORDER — LIDOCAINE HCL (PF) 1 % IJ SOLN
INTRAMUSCULAR | Status: AC
  Filled 2022-08-08: qty 30

## 2022-08-08 MED ORDER — VERAPAMIL HCL 2.5 MG/ML IV SOLN
INTRAVENOUS | Status: AC
  Filled 2022-08-08: qty 2

## 2022-08-08 MED ORDER — SODIUM CHLORIDE 0.9% FLUSH: 3.0000 mL | Freq: Two times a day (BID) | INTRAVENOUS | Status: DC

## 2022-08-08 MED ORDER — HEPARIN SODIUM (PORCINE) 1000 UNIT/ML IJ SOLN
INTRAMUSCULAR | Status: DC | PRN
  Administered 2022-08-08: 4500 [IU] via INTRAVENOUS

## 2022-08-08 MED ORDER — ASPIRIN 81 MG PO CHEW: 81.0000 mg | CHEWABLE_TABLET | ORAL | Status: AC

## 2022-08-08 SURGICAL SUPPLY — 10 items
CATH INFINITI 5 FR JL3.5 (CATHETERS) IMPLANT
CATH INFINITI JR4 5F (CATHETERS) IMPLANT
DEVICE RAD COMP TR BAND LRG (VASCULAR PRODUCTS) IMPLANT
GLIDESHEATH SLEND SS 6F .021 (SHEATH) IMPLANT
GUIDEWIRE INQWIRE 1.5J.035X260 (WIRE) IMPLANT
INQWIRE 1.5J .035X260CM (WIRE) ×2
KIT HEART LEFT (KITS) ×1 IMPLANT
PACK CARDIAC CATHETERIZATION (CUSTOM PROCEDURE TRAY) ×1 IMPLANT
TRANSDUCER W/STOPCOCK (MISCELLANEOUS) ×1 IMPLANT
TUBING CIL FLEX 10 FLL-RA (TUBING) ×1 IMPLANT

## 2022-08-08 NOTE — Progress Notes (Signed)
TR BAND REMOVAL  LOCATION:    right radial  DEFLATED PER PROTOCOL:    Yes.    TIME BAND OFF / DRESSING APPLIED: 08/08/22 at [place] ARRIVAL:    Level 0  SITE AFTER BAND REMOVAL:    Level 0  CIRCULATION SENSATION AND MOVEMENT:    Within Normal Limits   Yes.    COMMENTS:

## 2022-08-08 NOTE — Interval H&P Note (Signed)
History and Physical Interval Note:  08/08/2022 7:08 AM  Carl Jimenez  has presented today for surgery, with the diagnosis of abnormal CT.  The various methods of treatment have been discussed with the patient and family. After consideration of risks, benefits and other options for treatment, the patient has consented to  Procedure(s): LEFT HEART CATH AND CORONARY ANGIOGRAPHY (N/A) as a surgical intervention.  The patient's history has been reviewed, patient examined, no change in status, stable for surgery.  I have reviewed the patient's chart and labs.  Questions were answered to the patient's satisfaction.   Cath Lab Visit (complete for each Cath Lab visit)  Clinical Evaluation Leading to the Procedure:   ACS: No.  Non-ACS:    Anginal Classification: CCS I  Anti-ischemic medical therapy: Minimal Therapy (1 class of medications)  Non-Invasive Test Results: Low-risk stress test findings: cardiac mortality <1%/year  Prior CABG: No previous CABG        [facility] 08/08/2022 7:08 AM

## 2022-08-14 ENCOUNTER — Telehealth: Payer: Self-pay | Admitting: *Deleted

## 2022-08-14 ENCOUNTER — Ambulatory Visit: Payer: Medicare Other | Attending: Internal Medicine

## 2022-08-14 DIAGNOSIS — G4733 Obstructive sleep apnea (adult) (pediatric): Secondary | ICD-10-CM | POA: Diagnosis not present

## 2022-08-14 NOTE — Telephone Encounter (Signed)
Prior Authorization for [facility] sent to [facility] via Phone. Reference # .  NO PA REQ

## 2022-08-14 NOTE — Progress Notes (Signed)
Patient is present today for Nurse Visit- [facility] Sleep Study to r/o OSA. See stop bang score attached to note.

## 2022-08-14 NOTE — Progress Notes (Signed)
Sleep Apnea Evaluation  [facility]  Today's Date: 08/14/2022   Patient Name: Carl Jimenez        DOB: 03/31/1956       Height:        Weight:    BMI: There is no height or weight on file to calculate BMI.    Referring Provider:  Vishnu Mallipeddi. MD   STOP-BANG RISK ASSESSMENT         If STOP-BANG Score ?3 OR two clinical symptoms - patient qualifies for WatchPAT (CPT 95800)      Sleep study ordered due to two (2) of the following clinical symptoms/diagnoses:  Excessive daytime sleepiness G47.10  Gastroesophageal reflux K21.9  Nocturia R35.1  Morning Headaches G44.221  Difficulty concentrating R41.840  Memory problems or poor judgment G31.84  Personality changes or irritability R45.4  Loud snoring R06.83  Depression F32.9  Unrefreshed by sleep G47.8  Impotence N52.9  History of high blood pressure R03.0  Insomnia G47.00  Sleep Disordered Breathing or Sleep Apnea ICD G47.33

## 2022-08-15 NOTE — Telephone Encounter (Signed)
Patient notified and provided pin # ([phone]) for Itamar Device. Patient had no questions or concerns at this time.

## 2022-08-15 NOTE — Telephone Encounter (Signed)
Left a message for patient to call office back regarding [facility] Sleep Study.

## 2022-08-16 ENCOUNTER — Encounter (INDEPENDENT_AMBULATORY_CARE_PROVIDER_SITE_OTHER): Payer: Medicare Other | Admitting: Cardiology

## 2022-08-16 DIAGNOSIS — G4733 Obstructive sleep apnea (adult) (pediatric): Secondary | ICD-10-CM

## 2022-08-17 ENCOUNTER — Ambulatory Visit: Payer: Medicare Other | Attending: Internal Medicine

## 2022-08-17 ENCOUNTER — Ambulatory Visit: Payer: Medicare Other | Admitting: Podiatry

## 2022-08-17 DIAGNOSIS — J449 Chronic obstructive pulmonary disease, unspecified: Secondary | ICD-10-CM | POA: Diagnosis not present

## 2022-08-17 DIAGNOSIS — L409 Psoriasis, unspecified: Secondary | ICD-10-CM | POA: Diagnosis not present

## 2022-08-17 DIAGNOSIS — G4733 Obstructive sleep apnea (adult) (pediatric): Secondary | ICD-10-CM

## 2022-08-17 DIAGNOSIS — I700 Atherosclerosis of aorta: Secondary | ICD-10-CM | POA: Diagnosis not present

## 2022-08-17 DIAGNOSIS — F32A Depression, unspecified: Secondary | ICD-10-CM | POA: Diagnosis not present

## 2022-08-17 DIAGNOSIS — B070 Plantar wart: Secondary | ICD-10-CM | POA: Diagnosis not present

## 2022-08-17 DIAGNOSIS — E1169 Type 2 diabetes mellitus with other specified complication: Secondary | ICD-10-CM | POA: Diagnosis not present

## 2022-08-17 DIAGNOSIS — I10 Essential (primary) hypertension: Secondary | ICD-10-CM | POA: Diagnosis not present

## 2022-08-17 NOTE — Procedures (Signed)
SLEEP STUDY REPORT Patient Information Study Date: 08/17/2022 Patient Name: Carl Jimenez Patient ID: [id number] Birth Date: 1955/10/29 Age: 67 Gender: Male BMI: 27.2 (W=200 lb, H=6' 0'') Stopbang: 5 Referring Physician: Claudina Lick, MD  TEST DESCRIPTION: Home sleep apnea testing was completed using the WatchPat, a Type 1 device, utilizing peripheral arterial tonometry (PAT), chest movement, actigraphy, pulse oximetry, pulse rate, body position and snore. AHI was calculated with apnea and hypopnea using valid sleep time as the denominator. RDI includes apneas, hypopneas, and RERAs. The data acquired and the scoring of sleep and all associated events were performed in accordance with the recommended standards and specifications as outlined in the AASM Manual for the Scoring of Sleep and Associated Events 2.2.0 (2015).   FINDINGS:   1. Mild Obstructive Sleep Apnea with AHI 6.7/hr. Supine AHI 8.9/hr, non supine AHI 5.1/hr.  2. No Central Sleep Apnea with pAHIc 0.7/hr.   3. Oxygen desaturations as low as 83%.   4. Mild snoring was present. O2 sats were < 88% for 1.5 min.   5. Total sleep time was 6 hrs and 14 min.   6. 19.3% of total sleep time was spent in REM sleep.   7. Normal sleep onset latency at 18 min.   8. Prolonged REM sleep onset latency at 152 min.   9. Total awakenings were 9.  10. Arrhythmia detection:  None.  DIAGNOSIS: Mild Obstructive Sleep Apnea (G47.33)  RECOMMENDATIONS:   1.  Clinical correlation of these findings is necessary.  The decision to treat obstructive sleep apnea (OSA) is usually based on the presence of apnea symptoms or the presence of associated medical conditions such as Hypertension, Congestive Heart Failure, Atrial Fibrillation or Obesity.  The most common symptoms of OSA are snoring, gasping for breath while sleeping, daytime sleepiness and fatigue.   2.  Initiating apnea therapy is recommended given the presence of symptoms and/or  associated conditions. Recommend proceeding with one of the following:     a.  Auto-CPAP therapy with a pressure range of 5-20cm H2O.     b.  An oral appliance (OA) that can be obtained from certain dentists with expertise in sleep medicine.  These are primarily of use in non-obese patients with mild and moderate disease.     c.  An ENT consultation which may be useful to look for specific causes of obstruction and possible treatment options.     d.  If patient is intolerant to PAP therapy, consider referral to ENT for evaluation for hypoglossal nerve stimulator.   3.  Close follow-up is necessary to ensure success with CPAP or oral appliance therapy for maximum benefit.  4.  A follow-up oximetry study on CPAP is recommended to assess the adequacy of therapy and determine the need for supplemental oxygen or the potential need for Bi-level therapy.  An arterial blood gas to determine the adequacy of baseline ventilation and oxygenation should also be considered.  5.  Healthy sleep recommendations include:  adequate nightly sleep (normal 7-9 hrs/night), avoidance of caffeine after noon and alcohol near bedtime, and maintaining a sleep environment that is cool, dark and quiet.  6.  Weight loss for overweight patients is recommended.  Even modest amounts of weight loss can significantly improve the severity of sleep apnea.  7.  Snoring recommendations include:  weight loss where appropriate, side sleeping, and avoidance of alcohol before bed.  8.  Operation of motor vehicle should be avoided when sleepy.  Signature: Fransico Him, MD; [facility]; Diplomat,  American Board of Sleep Medicine Electronically Signed: 08/17/2022

## 2022-08-18 ENCOUNTER — Telehealth: Payer: Self-pay | Admitting: *Deleted

## 2022-08-18 NOTE — Telephone Encounter (Signed)
RETURN CALL:  The patient has been notified of the result and verbalized understanding.  All questions (if any) were answered. Carl Jimenez, [employer] 08/18/2022 5:42 PM    Patient virtual visit would work better for patient.

## 2022-08-18 NOTE — Telephone Encounter (Signed)
-----   Message from Lauralee Evener, [place] sent at 08/18/2022  8:52 AM EST -----  ----- Message ----- From: Sueanne Margarita, MD Sent: 08/17/2022  12:14 PM EST To: Cv Div Sleep Studies  Patient has very mild OSA - set up OV to discuss treatment options.

## 2022-08-18 NOTE — Telephone Encounter (Signed)
The patient has been notified of the result. Left detailed message on voicemail and informed patient to call back. Tenley Winward [place], CMA.   

## 2022-08-30 NOTE — Telephone Encounter (Signed)
08/30/22 LVM to schedule appt with Dr. Radford Pax to discuss treatment options - LCN

## 2022-09-06 ENCOUNTER — Ambulatory Visit: Payer: Medicare Other | Attending: Internal Medicine | Admitting: Internal Medicine

## 2022-09-06 ENCOUNTER — Encounter: Payer: Self-pay | Admitting: Internal Medicine

## 2022-09-06 VITALS — BP 148/76 | HR 47 | Ht 72.0 in | Wt 203.0 lb

## 2022-09-06 DIAGNOSIS — G4733 Obstructive sleep apnea (adult) (pediatric): Secondary | ICD-10-CM | POA: Diagnosis not present

## 2022-09-06 MED ORDER — NITROGLYCERIN 0.4 MG SL SUBL: 0.4000 mg | SUBLINGUAL_TABLET | SUBLINGUAL | 3 refills | Status: AC | PRN

## 2022-09-06 NOTE — Patient Instructions (Signed)
Medication Instructions:  Your physician has recommended you make the following change in your medication:  - Start SL Nitro 0.4 mg tablets as needed for chest pain. The proper use and anticipated side effects of nitroglycerine has been carefully explained.  If a single episode of chest pain is not relieved by one tablet, the patient will try another within 5 minutes; and if this doesn't relieve the pain, the patient is instructed to call 911 for transportation to an emergency department.  Labwork: None  Testing/Procedures: None  Follow-Up: Follow up with Dr. Dellia Cloud in 6 months.   Any Other Special Instructions Will Be Listed Below (If Applicable).     If you need a refill on your cardiac medications before your next appointment, please call your pharmacy.

## 2022-09-06 NOTE — Progress Notes (Signed)
Cardiology Office Note  Date: 09/06/2022   ID: Carl Jimenez, DOB 11/30/1955, MRN [id number]  PCP:  Limon Nation, MD  Cardiologist:  Chalmers Guest, MD Electrophysiologist:  None   Reason for Office Visit: Follow-up after LHC   History of Present Illness: Carl Jimenez is a 67 y.o. male known to have known to have CAD manifested by abnormal CCTA status post single-vessel CAD on medical management, HTN, DM 2, COPD, HLD, mild OSA presented to cardiology clinic for follow-up visit.  Patient was initially referred to cardiology clinic for evaluation of incidental finding of moderate coronary artery calcifications on CT chest lung cancer screening. Patient underwent CCTA that showed focal stenosis of distal LCx, FFR 0.66. He reported having decrease in stamina levels for the last 1 year, DOE with strenuous activities and also substernal resting chest pains lasting for 15 to 20 minutes occurring 3 times per week (but not with exertion). He underwent LHC in 07/2022 that showed 95% LCx stenosis but no PCI was performed as patient had class I anginal symptoms. Plan was to perform PCI only if patient has medically refractory angina.  He is here for follow-up visit. He continues to have chest pains at rest, lasting for a few seconds and unable to recall the frequency or duration more precisely. Instructed him to keep a log of all the chest pain symptoms.  He does have some SOB but not worse than before, has some dizziness when he bends over but no dizziness with exertion, denies palpitations or syncope. Overall doing great but worried moving forward about the blockage in his heart vessels. Former smoker, quit 5 years ago. He underwent WatchPAT study in the last clinic visit and was diagnosed with mild OSA.  Past Medical History:  Diagnosis Date   COPD (chronic obstructive pulmonary disease) ([place])    Depression    Diabetes mellitus without complication ([place])    Hypertension    Psoriasis      Past Surgical History:  Procedure Laterality Date   BIOPSY  06/15/2021   Procedure: BIOPSY;  Surgeon: Harvel Quale, MD;  [facility];  Service: Gastroenterology;;  gastric, distal, esophageal, mid-esophageal   BIOPSY  08/16/2021   Procedure: BIOPSY;  Surgeon: Harvel Quale, MD;  [facility];  Service: Gastroenterology;;   CATARACT EXTRACTION W/PHACO Left 07/29/2021   Procedure: CATARACT EXTRACTION PHACO AND INTRAOCULAR LENS PLACEMENT ([place]);  Surgeon: Baruch Goldmann, MD;  Location: [facility];  Service: Ophthalmology;  Laterality: Left;  CDE 5.89   CATARACT EXTRACTION W/PHACO Right 08/12/2021   Procedure: CATARACT EXTRACTION PHACO AND INTRAOCULAR LENS PLACEMENT ([facility]);  Surgeon: Baruch Goldmann, MD;  Location: [facility];  Service: Ophthalmology;  Laterality: Right;  CDE: 6.69   ESOPHAGOGASTRODUODENOSCOPY (EGD) WITH PROPOFOL N/A 06/15/2021   Procedure: ESOPHAGOGASTRODUODENOSCOPY (EGD) WITH PROPOFOL;  Surgeon: Harvel Quale, MD;  [facility];  Service: Gastroenterology;  Laterality: N/A;   ESOPHAGOGASTRODUODENOSCOPY (EGD) WITH PROPOFOL N/A 08/16/2021   Procedure: ESOPHAGOGASTRODUODENOSCOPY (EGD) WITH PROPOFOL;  Surgeon: Harvel Quale, MD;  [facility];  Service: Gastroenterology;  Laterality: N/A;  905   HERNIA REPAIR     LEFT HEART CATH AND CORONARY ANGIOGRAPHY N/A 08/08/2022   Procedure: LEFT HEART CATH AND CORONARY ANGIOGRAPHY;  Surgeon: Martinique, Peter M, MD;  Location: [place] [facility];  Service: Cardiovascular;  Laterality: N/A;    Current Outpatient Medications  Medication Sig Dispense Refill   albuterol (VENTOLIN HFA) 108 (90 Base) MCG/ACT inhaler Inhale  2 puffs into the lungs every 6 (six) hours as needed for wheezing or shortness of breath.     aspirin EC 81 MG tablet Take 81 mg by mouth daily. Swallow whole.     citalopram (CELEXA) 20 MG tablet Take 20 mg by mouth daily.     diphenhydrAMINE HCl,  Sleep, 50 MG CAPS Take 50 mg by mouth at bedtime.     empagliflozin (JARDIANCE) 10 MG TABS tablet Take 10 mg by mouth daily.     losartan (COZAAR) 50 MG tablet Take 50 mg by mouth daily.     metFORMIN (GLUCOPHAGE-XR) 500 MG 24 hr tablet Take 500 mg by mouth 2 (two) times daily.     metoprolol tartrate (LOPRESSOR) 25 MG tablet Take 1 tablet (25 mg total) by mouth 2 (two) times daily. 60 tablet 3   Misc Natural Products (GLUCOS-CHONDROIT-MSM COMPLEX PO) Take 1 tablet by mouth daily.     Multiple Vitamin (MULTIVITAMIN) tablet Take 1 tablet by mouth daily.     nitroGLYCERIN (NITROSTAT) 0.4 MG SL tablet Place 1 tablet (0.4 mg total) under the tongue every 5 (five) minutes as needed for chest pain. f a single episode of chest pain is not relieved by one tablet, the patient will try another within 5 minutes; and if this doesn't relieve the pain, the patient is instructed to call 911 for transportation to an emergency department. 25 tablet 3   omeprazole (PRILOSEC) 40 MG capsule Take 1 capsule (40 mg total) by mouth daily. 90 capsule 3   rosuvastatin (CRESTOR) 20 MG tablet Take 20 mg by mouth at bedtime.     SPIRIVA HANDIHALER 18 MCG inhalation capsule Place 18 mcg into inhaler and inhale daily.     Turmeric 500 MG TABS Take 500 mg by mouth daily.     No current facility-administered medications for this visit.   Allergies:  Patient has no known allergies.   Social History: The patient  reports that he has never smoked. He has never used smokeless tobacco. He reports that he does not drink alcohol and does not use drugs.   Family History: The patient's family history is not on file.   ROS:  Please see the history of present illness. Otherwise, complete review of systems is positive for none.  All other systems are reviewed and negative.   Physical Exam: VS:  BP (!) 148/76   Pulse (!) 47   Ht 6' (1.829 m)   Wt 203 lb (92.1 kg)   SpO2 96%   BMI 27.53 kg/m , BMI Body mass index is 27.53  kg/m.  Wt Readings from Last 3 Encounters:  09/06/22 203 lb (92.1 kg)  08/08/22 201 lb (91.2 kg)  08/02/22 201 lb (91.2 kg)    General: Patient appears comfortable at rest. HEENT: Conjunctiva and lids normal, oropharynx clear with moist mucosa. Neck: Supple, no elevated JVP or carotid bruits, no thyromegaly. Lungs: Clear to auscultation, nonlabored breathing at rest. Cardiac: Regular rate and rhythm, no S3 or significant systolic murmur, no pericardial rub. Abdomen: Soft, nontender, no hepatomegaly, bowel sounds present, no guarding or rebound. Extremities: No pitting edema, distal pulses 2+. Skin: Warm and dry. Musculoskeletal: No kyphosis. Neuropsychiatric: Alert and oriented x3, affect grossly appropriate.  ECG:  An ECG dated 07/18/2022 was personally reviewed today and demonstrated:  Normal sinus rhythm and no ST changes  Recent Labwork: 07/19/2022: BUN 15; Creatinine, Ser 0.92; Potassium 4.0; Sodium 142 08/02/2022: Hemoglobin 13.9; Platelets 214  No results found for: "CHOL", "  TRIG", "HDL", "CHOLHDL", "VLDL", "LDLCALC", "LDLDIRECT"  Other Studies Reviewed Today:   Assessment and Plan: Patient is a 67 year old M known to have CAD manifested by abnormal CCTA status post single-vessel CAD on medical management, HTN, DM 2, COPD, HLD, mild OSA presented to cardiology clinic for follow-up visit.  # CAD manifested by abnormal CCTA in 06/2022 s/p single-vessel CAD (95% LCx disease, plans for PCI only if chest pain is refractory to medical management) with normal LVEF, currently angina free -Patient has 95% LCx stenosis but has class I anginal symptoms. Continue aspirin 81 mg once daily and high intensity statin, rosuvastatin 20 mg nightly. Continue metoprolol tartrate 25 mg twice daily and start SL NTG 0.4 mg as needed for chest pains. -If patient continues to have chest pains refractory to medical management, he will undergo PCI per IC note. Patient voiced understanding. Instructed patient  to keep a log of chest pain symptoms and bring to the clinic in the next visit.  # HTN, controlled -Continue metoprolol tartrate 25 mg twice daily -Continue losartan 50 mg once daily  # HLD -Continue rosuvastatin 20 mg nightly.  Goal LDL less than 70.  # Mild OSA -Patient underwent WatchPAT testing recently and was diagnosed with mild OSA.  Follow-up with sleep medicine.  I have spent a total of 33 minutes with patient reviewing chart, EKGs, labs and examining patient as well as establishing an assessment and plan that was discussed with the patient.  > 50% of time was spent in direct patient care.     Medication Adjustments/Labs and Tests Ordered: Current medicines are reviewed at length with the patient today.  Concerns regarding medicines are outlined above.   Tests Ordered: No orders of the defined types were placed in this encounter.   Medication Changes: Meds ordered this encounter  Medications   nitroGLYCERIN (NITROSTAT) 0.4 MG SL tablet    Sig: Place 1 tablet (0.4 mg total) under the tongue every 5 (five) minutes as needed for chest pain. f a single episode of chest pain is not relieved by one tablet, the patient will try another within 5 minutes; and if this doesn't relieve the pain, the patient is instructed to call 911 for transportation to an emergency department.    Dispense:  25 tablet    Refill:  3    Disposition:  Follow up  6 months  Signed Alyannah Sanks Fidel Levy, MD, 09/06/2022 10:44 AM    [facility] [place], [place], [place] [address]

## 2022-10-16 ENCOUNTER — Other Ambulatory Visit (INDEPENDENT_AMBULATORY_CARE_PROVIDER_SITE_OTHER): Payer: Self-pay | Admitting: Gastroenterology

## 2022-10-16 DIAGNOSIS — T18128A Food in esophagus causing other injury, initial encounter: Secondary | ICD-10-CM

## 2022-10-16 DIAGNOSIS — K219 Gastro-esophageal reflux disease without esophagitis: Secondary | ICD-10-CM

## 2022-10-31 ENCOUNTER — Ambulatory Visit: Payer: Medicare Other | Attending: Cardiology | Admitting: Cardiology

## 2022-10-31 ENCOUNTER — Telehealth: Payer: Self-pay | Admitting: *Deleted

## 2022-10-31 ENCOUNTER — Encounter: Payer: Self-pay | Admitting: Cardiology

## 2022-10-31 VITALS — Ht 72.0 in | Wt 202.0 lb

## 2022-10-31 DIAGNOSIS — G4733 Obstructive sleep apnea (adult) (pediatric): Secondary | ICD-10-CM | POA: Diagnosis not present

## 2022-10-31 DIAGNOSIS — I10 Essential (primary) hypertension: Secondary | ICD-10-CM | POA: Diagnosis not present

## 2022-10-31 NOTE — Progress Notes (Unsigned)
SLEEP MEDICINE VIRTUAL CONSULT NOTE via Video Note   Because of Corvin Ramsaran's co-morbid illnesses, he is at least at moderate risk for complications without adequate follow up.  This format is felt to be most appropriate for this patient at this time.  All issues noted in this document were discussed and addressed.  A limited physical exam was performed with this format.  Please refer to the patient's chart for his consent to telehealth for [facility].   Date:  11/01/2022   ID:  [name], DOB [birth year], MRN [id number] The patient was identified using 2 identifiers.  Patient Location: Home Provider Location: Home Office   PCP:  Donetta Potts, MD   [facility] Providers Cardiologist:  Marjo Bicker, MD     Evaluation Performed:  New Patient Evaluation  Chief Complaint:  OSA  History of Present Illness:    Henderson Halili is a 67 y.o. male who is being seen today for the evaluation of OSA at the request of Luane School, MD.  Callum Kubicek is a 67 y.o. male with a history of COPD, depression, diabetes mellitus, hypertension.  He was seen by cardiology in February and was complaining of excessive daytime sleepiness with need for taking morning naps as well as insomnia.  He underwent home sleep study showing mild obstructive sleep apnea with an AHI of 6.7 /h overall and 12.9/h during REM sleep.  Most events occurred during supine sleep.  He is now here to discuss results of sleep study.  He tells me that he would feel tired when he gets up in the am and is up for an hour and ready to go back to bed.  He has to take naps during the day up to 3 if it is raining outside.  He has not awakened himself snoring or gasping for breath.  He does wake up with a dry mouth at times.  He has had HAs when he wakes up in the am as well.  He is not sure if the HAs are related to allergies. Stop Bang score is 5 but he does not know if he snores because he sleeps  alone.  He goes to bed around 10pm and gets up at 7am and does not wake up during the night.   Past Medical History:  Diagnosis Date   COPD (chronic obstructive pulmonary disease) ([facility])    Depression    Diabetes mellitus without complication ([facility])    Hypertension    OSA (obstructive sleep apnea)    AHI of 6.7 /h overall and 12.9/h during REM sleep   Psoriasis    Past Surgical History:  Procedure Laterality Date   BIOPSY  06/15/2021   Procedure: BIOPSY;  Surgeon: Dolores Frame, MD;  [facility];  Service: Gastroenterology;;  gastric, distal, esophageal, mid-esophageal   BIOPSY  08/16/2021   Procedure: BIOPSY;  Surgeon: Dolores Frame, MD;  [facility];  Service: Gastroenterology;;   CATARACT EXTRACTION W/PHACO Left 07/29/2021   Procedure: CATARACT EXTRACTION PHACO AND INTRAOCULAR LENS PLACEMENT ([facility]);  Surgeon: Fabio Pierce, MD;  [facility];  Service: Ophthalmology;  Laterality: Left;  CDE 5.89   CATARACT EXTRACTION W/PHACO Right 08/12/2021   Procedure: CATARACT EXTRACTION PHACO AND INTRAOCULAR LENS PLACEMENT ([facility]);  Surgeon: Fabio Pierce, MD;  Location: [facility];  Service: Ophthalmology;  Laterality: Right;  CDE: 6.69   ESOPHAGOGASTRODUODENOSCOPY (EGD) WITH PROPOFOL N/A 06/15/2021   Procedure: ESOPHAGOGASTRODUODENOSCOPY (EGD) WITH PROPOFOL;  Surgeon: Marguerita Merles, Reuel Boom, MD;  [facility];  Service: Gastroenterology;  Laterality: N/A;   ESOPHAGOGASTRODUODENOSCOPY (EGD) WITH PROPOFOL N/A 08/16/2021   Procedure: ESOPHAGOGASTRODUODENOSCOPY (EGD) WITH PROPOFOL;  Surgeon: Dolores Frame, MD;  [facility];  Service: Gastroenterology;  Laterality: N/A;  905   HERNIA REPAIR     LEFT HEART CATH AND CORONARY ANGIOGRAPHY N/A 08/08/2022   Procedure: LEFT HEART CATH AND CORONARY ANGIOGRAPHY;  Surgeon: [place], Peter M, MD;  Location: [facility];  Service: Cardiovascular;  Laterality: N/A;     No  outpatient medications have been marked as taking for the 10/31/22 encounter (Video Visit) with Quintella Reichert, MD.     Allergies:   Patient has no known allergies.   Social History   Tobacco Use   Smoking status: Never   Smokeless tobacco: Never  Vaping Use   Vaping Use: Never used  Substance Use Topics   Alcohol use: Never   Drug use: Never     Family Hx: The patient's family history is not on file.  ROS:   Please see the history of present illness.     All other systems reviewed and are negative.   Prior Sleep studies:   The following studies were reviewed today:  Home sleep study  Labs/Other Tests and Data Reviewed:     Recent Labs: 07/19/2022: BUN 15; Creatinine, Ser 0.92; Potassium 4.0; Sodium 142 08/02/2022: Hemoglobin 13.9; Platelets 214    Wt Readings from Last 3 Encounters:  10/31/22 202 lb (91.6 kg)  09/06/22 203 lb (92.1 kg)  08/08/22 201 lb (91.2 kg)     Risk Assessment/Calculations:      STOP-Bang Score:  4      Objective:    Vital Signs:  Ht 6' (1.829 m)   Wt 202 lb (91.6 kg)   BMI 27.40 kg/m    VITAL SIGNS:  reviewed GEN:  no acute distress EYES:  sclerae anicteric, EOMI - Extraocular Movements Intact RESPIRATORY:  normal respiratory effort, symmetric expansion CARDIOVASCULAR:  no peripheral edema SKIN:  no rash, lesions or ulcers. MUSCULOSKELETAL:  no obvious deformities. NEURO:  alert and oriented x 3, no obvious focal deficit PSYCH:  normal affect  ASSESSMENT & PLAN:    OSA  -Home sleep study showed overall mild sleep apnea with an AHI of 6.7 /h overall and 12.9/h during REM sleep -He has significant daytime sleepiness despite getting hours of sleep nightly with a Stop Bang score 5. -I think he would benefit from a trial of CPAP therapy -I will order a ResMed auto CPAP from 4 to 12cm H2O with heated humidity and mask of choice -followup with me 6 weeks after getting his device.   Hypertension -BP has been controlled at  home -Continue prescription drug management with losartan 50 mg daily, Lopressor 25 mg twice daily with as needed refills   Time:   Today, I have spent 15 minutes with the patient with telehealth technology discussing the above problems.     Medication Adjustments/Labs and Tests Ordered: Current medicines are reviewed at length with the patient today.  Concerns regarding medicines are outlined above.   Tests Ordered: No orders of the defined types were placed in this encounter.   Medication Changes: No orders of the defined types were placed in this encounter.   Follow Up:  In Person in 6 week(s) after getting his CPAP  Signed, Armanda Magic, MD  11/01/2022 1:26 PM     [facility]

## 2022-10-31 NOTE — Telephone Encounter (Signed)
Per Dr Mayford Knife, Order ResMed auto CPAP from 4 to 12cm H2O with heated humidity and mask of choice -followup with me 6 weeks after getting his device.

## 2022-10-31 NOTE — Telephone Encounter (Signed)
DME selection is [facility]. Patient understands he will be contacted by [facility] to set up his cpap. Patient understands to call if [facility] does not contact him with new setup in a timely manner. Patient understands they will be called once confirmation has been received from [facility]/ that they have received their new machine to schedule 10 week follow up appointment.   [facility] notified of new cpap order  Please add to airview Patient was grateful for the call and thanked me. 

## 2022-11-02 ENCOUNTER — Other Ambulatory Visit: Payer: Self-pay

## 2022-11-02 MED ORDER — METOPROLOL TARTRATE 25 MG PO TABS: 25.0000 mg | ORAL_TABLET | Freq: Two times a day (BID) | ORAL | 3 refills | Status: DC

## 2022-11-07 DIAGNOSIS — I10 Essential (primary) hypertension: Secondary | ICD-10-CM | POA: Diagnosis not present

## 2022-11-07 DIAGNOSIS — E1169 Type 2 diabetes mellitus with other specified complication: Secondary | ICD-10-CM | POA: Diagnosis not present

## 2022-11-13 DIAGNOSIS — G4733 Obstructive sleep apnea (adult) (pediatric): Secondary | ICD-10-CM | POA: Diagnosis not present

## 2022-11-15 DIAGNOSIS — I10 Essential (primary) hypertension: Secondary | ICD-10-CM | POA: Diagnosis not present

## 2022-11-15 DIAGNOSIS — I700 Atherosclerosis of aorta: Secondary | ICD-10-CM | POA: Diagnosis not present

## 2022-11-15 DIAGNOSIS — E1169 Type 2 diabetes mellitus with other specified complication: Secondary | ICD-10-CM | POA: Diagnosis not present

## 2022-11-15 DIAGNOSIS — B070 Plantar wart: Secondary | ICD-10-CM | POA: Diagnosis not present

## 2022-11-15 DIAGNOSIS — J449 Chronic obstructive pulmonary disease, unspecified: Secondary | ICD-10-CM | POA: Diagnosis not present

## 2022-11-15 DIAGNOSIS — F32A Depression, unspecified: Secondary | ICD-10-CM | POA: Diagnosis not present

## 2022-11-15 DIAGNOSIS — R001 Bradycardia, unspecified: Secondary | ICD-10-CM | POA: Diagnosis not present

## 2022-11-15 DIAGNOSIS — L409 Psoriasis, unspecified: Secondary | ICD-10-CM | POA: Diagnosis not present

## 2022-11-16 ENCOUNTER — Other Ambulatory Visit (INDEPENDENT_AMBULATORY_CARE_PROVIDER_SITE_OTHER): Payer: Self-pay | Admitting: *Deleted

## 2022-11-16 DIAGNOSIS — K219 Gastro-esophageal reflux disease without esophagitis: Secondary | ICD-10-CM

## 2022-11-16 DIAGNOSIS — W44F3XA Food entering into or through a natural orifice, initial encounter: Secondary | ICD-10-CM

## 2022-11-16 MED ORDER — OMEPRAZOLE 40 MG PO CPDR: 40.0000 mg | DELAYED_RELEASE_CAPSULE | Freq: Two times a day (BID) | ORAL | 0 refills | Status: DC

## 2022-11-17 ENCOUNTER — Other Ambulatory Visit (INDEPENDENT_AMBULATORY_CARE_PROVIDER_SITE_OTHER): Payer: Self-pay | Admitting: Gastroenterology

## 2022-11-17 DIAGNOSIS — T18128A Food in esophagus causing other injury, initial encounter: Secondary | ICD-10-CM

## 2022-11-17 DIAGNOSIS — K219 Gastro-esophageal reflux disease without esophagitis: Secondary | ICD-10-CM

## 2022-11-17 NOTE — Telephone Encounter (Signed)
Filled on 11/16/2022, must keep upcoming appt for further fills.

## 2022-12-05 ENCOUNTER — Ambulatory Visit (INDEPENDENT_AMBULATORY_CARE_PROVIDER_SITE_OTHER): Payer: Medicare Other | Admitting: Gastroenterology

## 2022-12-05 ENCOUNTER — Encounter (INDEPENDENT_AMBULATORY_CARE_PROVIDER_SITE_OTHER): Payer: Self-pay | Admitting: Gastroenterology

## 2022-12-05 VITALS — BP 130/68 | HR 53 | Temp 97.9°F | Ht 72.0 in | Wt 202.5 lb

## 2022-12-05 DIAGNOSIS — K219 Gastro-esophageal reflux disease without esophagitis: Secondary | ICD-10-CM | POA: Insufficient documentation

## 2022-12-05 MED ORDER — OMEPRAZOLE 40 MG PO CPDR: 40.0000 mg | DELAYED_RELEASE_CAPSULE | Freq: Every day | ORAL | 3 refills | Status: DC

## 2022-12-05 NOTE — Patient Instructions (Signed)
Please continue with omeprazole 40mg  daily We will plan to see you back in 1 year unless you have any new or worsening GI symptoms Please let me know if you change your mind about having a repeat screening colonoscopy

## 2022-12-05 NOTE — Progress Notes (Signed)
Referring Provider: Donetta Potts, MD Primary Care Physician:  Donetta Potts, MD Primary GI Physician: Levon Hedger   Chief Complaint  Patient presents with   Gastroesophageal Reflux    Follow up on GERD. Takes omeprazole once in the mornings ( prescribed bid ) reports once daily is doing well controlling symptoms.    HPI:   Carl Jimenez is a 67 y.o. male with past medical history of DM, HTN, high cholesterol, psoriasis, COPD, depression, hx of H pylori.   Patient presenting today for follow up of GERD  Last seen January 2023, at that time, feeling better since starting H pylori treatment about 1 week ago.  dysphagia resolved. Reports acid reflux medication has helped his reflux,  still maintained on Omeprazole 40mg  BID. alternations between constipation and diarrhea since starting metformin, he has about 2 BMs per day.    Recommended to continue with omeprazole 40mg  BID, repeat EGD February, complete H pylori course.  EGD as below, advised to decrease PPI to once daily.   Present:  Doing well on omeprazole 40mg  daily. No breakthrough symptoms. No red flag symptoms. Patient denies melena, hematochezia, nausea, vomiting, diarrhea, constipation, dysphagia, odyonophagia, early satiety or weight loss. He has no GI complaints today. He continues to decline scheduling any further screening colonoscopies.    Last Colonoscopy:>10 years, patient does not want to have another colonoscopy   Last Endoscopy:07/2021 - 1 cm hiatal hernia.                           - Salmon-colored mucosa suspicious for                            short-segment Barrett's esophagus. Biopsied.                           - Normal stomach. Biopsied.                           - Erythematous duodenopathy. (intestinal metaplasia in gastric biopsies but negative for H. pylori, changes of reflux esophagitis but no presence of Barrett's esophagus, repeat EGD in 3 years for gastric mapping.)    Recommendations:     Past Medical History:  Diagnosis Date   COPD (chronic obstructive pulmonary disease) (HCC)    Depression    Diabetes mellitus without complication (HCC)    Hypertension    OSA (obstructive sleep apnea)    AHI of 6.7 /h overall and 12.9/h during REM sleep   Psoriasis     Past Surgical History:  Procedure Laterality Date   BIOPSY  06/15/2021   Procedure: BIOPSY;  Surgeon: Dolores Frame, MD;  [facility];  Service: Gastroenterology;;  gastric, distal, esophageal, mid-esophageal   BIOPSY  08/16/2021   Procedure: BIOPSY;  Surgeon: Dolores Frame, MD;  [facility];  Service: Gastroenterology;;   CATARACT EXTRACTION W/PHACO Left 07/29/2021   Procedure: CATARACT EXTRACTION PHACO AND INTRAOCULAR LENS PLACEMENT (IOC);  Surgeon: Fabio Pierce, MD;  Location: [facility];  Service: Ophthalmology;  Laterality: Left;  CDE 5.89   CATARACT EXTRACTION W/PHACO Right 08/12/2021   Procedure: CATARACT EXTRACTION PHACO AND INTRAOCULAR LENS PLACEMENT (IOC);  Surgeon: Fabio Pierce, MD;  Location: [facility];  Service: Ophthalmology;  Laterality: Right;  CDE: 6.69   ESOPHAGOGASTRODUODENOSCOPY (EGD) WITH PROPOFOL N/A 06/15/2021   Procedure:  ESOPHAGOGASTRODUODENOSCOPY (EGD) WITH PROPOFOL;  Surgeon: Marguerita Merles, Reuel Boom, MD;  [facility];  Service: Gastroenterology;  Laterality: N/A;   ESOPHAGOGASTRODUODENOSCOPY (EGD) WITH PROPOFOL N/A 08/16/2021   Procedure: ESOPHAGOGASTRODUODENOSCOPY (EGD) WITH PROPOFOL;  Surgeon: Dolores Frame, MD;  [facility];  Service: Gastroenterology;  Laterality: N/A;  905   HERNIA REPAIR     LEFT HEART CATH AND CORONARY ANGIOGRAPHY N/A 08/08/2022   Procedure: LEFT HEART CATH AND CORONARY ANGIOGRAPHY;  Surgeon: Swaziland, Peter M, MD;  Location: [facility] INVASIVE CV LAB;  Service: Cardiovascular;  Laterality: N/A;    Current Outpatient Medications  Medication Sig Dispense Refill   albuterol (VENTOLIN HFA) 108 (90  Base) MCG/ACT inhaler Inhale 2 puffs into the lungs every 6 (six) hours as needed for wheezing or shortness of breath.     aspirin EC 81 MG tablet Take 81 mg by mouth daily. Swallow whole.     citalopram (CELEXA) 20 MG tablet Take 20 mg by mouth daily.     empagliflozin (JARDIANCE) 10 MG TABS tablet Take 10 mg by mouth daily.     losartan (COZAAR) 50 MG tablet Take 50 mg by mouth daily.     metFORMIN (GLUCOPHAGE-XR) 500 MG 24 hr tablet Take 500 mg by mouth 2 (two) times daily.     metoprolol tartrate (LOPRESSOR) 25 MG tablet Take 1 tablet (25 mg total) by mouth 2 (two) times daily. 60 tablet 3   Multiple Vitamin (MULTIVITAMIN) tablet Take 1 tablet by mouth daily.     nitroGLYCERIN (NITROSTAT) 0.4 MG SL tablet Place 1 tablet (0.4 mg total) under the tongue every 5 (five) minutes as needed for chest pain. f a single episode of chest pain is not relieved by one tablet, the patient will try another within 5 minutes; and if this doesn't relieve the pain, the patient is instructed to call 911 for transportation to an emergency department. 25 tablet 3   omeprazole (PRILOSEC) 40 MG capsule Take 1 capsule (40 mg total) by mouth 2 (two) times daily. 60 capsule 0   rosuvastatin (CRESTOR) 20 MG tablet Take 20 mg by mouth at bedtime.     SPIRIVA HANDIHALER 18 MCG inhalation capsule Place 18 mcg into inhaler and inhale daily.     No current facility-administered medications for this visit.    Allergies as of 12/05/2022   (No Known Allergies)    No family history on file.  Social History   Socioeconomic History   Marital status: Single    Spouse name: Not on file   Number of children: Not on file   Years of education: Not on file   Highest education level: Not on file  Occupational History   Not on file  Tobacco Use   Smoking status: Never    Passive exposure: Never   Smokeless tobacco: Never  Vaping Use   Vaping Use: Never used  Substance and Sexual Activity   Alcohol use: Never   Drug use:  Never   Sexual activity: Yes  Other Topics Concern   Not on file  Social History Narrative   Not on file   Social Determinants of Health   Financial Resource Strain: Not on file  Food Insecurity: Not on file  Transportation Needs: Not on file  Physical Activity: Not on file  Stress: Not on file  Social Connections: Not on file   Review of systems General: negative for malaise, night sweats, fever, chills, weight loss Neck: Negative for lumps, goiter, pain and significant  neck swelling Resp: Negative for cough, wheezing, dyspnea at rest CV: Negative for chest pain, leg swelling, palpitations, orthopnea GI: denies melena, hematochezia, nausea, vomiting, diarrhea, constipation, dysphagia, odyonophagia, early satiety or unintentional weight loss.  MSK: Negative for joint pain or swelling, back pain, and muscle pain. Derm: Negative for itching or rash Psych: Denies depression, anxiety, memory loss, confusion. No homicidal or suicidal ideation.  Heme: Negative for prolonged bleeding, bruising easily, and swollen nodes. Endocrine: Negative for cold or heat intolerance, polyuria, polydipsia and goiter. Neuro: negative for tremor, gait imbalance, syncope and seizures. The remainder of the review of systems is noncontributory.  Physical Exam: BP 130/68 (BP Location: Left Arm, Patient Position: Sitting, Cuff Size: Large)   Pulse (!) 53   Temp 97.9 F (36.6 C) (Oral)   Ht 6' (1.829 m)   Wt 202 lb 8 oz (91.9 kg)   BMI 27.46 kg/m  General:   Alert and oriented. No distress noted. Pleasant and cooperative.  Head:  Normocephalic and atraumatic. Eyes:  Conjuctiva clear without scleral icterus. Mouth:  Oral mucosa pink and moist. Good dentition. No lesions. Heart: Normal rate and rhythm, s1 and s2 heart sounds present.  Lungs: Clear lung sounds in all lobes. Respirations equal and unlabored. Abdomen:  +BS, soft, non-tender and non-distended. No rebound or guarding. No HSM or masses  noted. Derm: No palmar erythema or jaundice Msk:  Symmetrical without gross deformities. Normal posture. Extremities:  Without edema. Neurologic:  Alert and  oriented x4 Psych:  Alert and cooperative. Normal mood and affect.  Invalid input(s): "6 MONTHS"   ASSESSMENT: Carl Jimenez is a 67 y.o. male presenting today for follow up of GERD.  GERD: maintained on omeprazole 40mg  daily with good control of symptoms. No breakthrough. Will continue with PPI daily.   Previous H pylori in 2023 with eradication noted on EGD biopsy after treatment with quadruple bismuth therapy.   Last colonoscopy 10+ years ago. We have discussed updating screening colonoscopy in the past to which patient declined.  I discussed this again today with patient and he continues to decline pursuing any further screening colonoscopies.  He will let me know if he changes mind   PLAN:  Continue with omeprazole 40mg  daily  2. Repeat EGD 2026  3. Pt to let me know if he wishes to proceed with screening colonoscopy   All questions were answered, patient verbalized understanding and is in agreement with plan as outlined above.   Follow Up: 1 year   Mohamud Mrozek L. Jeanmarie Hubert, MSN, APRN, AGNP-C Adult-Gerontology Nurse Practitioner [facility]  I have reviewed the note and agree with the APP's assessment as described in this progress note  Patient is interested in not taking PPIs in the long-term, can discuss TIF procedure.  [name], MD Gastroenterology and Hepatology [facility]

## 2022-12-14 DIAGNOSIS — G4733 Obstructive sleep apnea (adult) (pediatric): Secondary | ICD-10-CM | POA: Diagnosis not present

## 2023-01-09 DIAGNOSIS — G4733 Obstructive sleep apnea (adult) (pediatric): Secondary | ICD-10-CM | POA: Diagnosis not present

## 2023-01-13 DIAGNOSIS — G4733 Obstructive sleep apnea (adult) (pediatric): Secondary | ICD-10-CM | POA: Diagnosis not present

## 2023-01-26 ENCOUNTER — Encounter: Payer: Self-pay | Admitting: Cardiology

## 2023-01-26 ENCOUNTER — Ambulatory Visit: Payer: Medicare Other | Admitting: Cardiology

## 2023-01-26 VITALS — BP 133/70 | HR 50 | Ht 72.0 in | Wt 202.0 lb

## 2023-01-26 NOTE — Progress Notes (Signed)
This encounter was created in error - please disregard.

## 2023-01-28 IMAGING — CT CT RENAL STONE PROTOCOL
2 of 4 series · 15 of 46 positions shown, 17 images · non-contrast
Comparison: Report from CT scan dated 11/16/2014

CLINICAL DATA: Left flank pain starting yesterday.  Nausea.



[Series 2: axial st · axial · 0.82mm/px · z∈[+674,+1124]mm · 12 of 102 slices shown, 14 images]
[im 6/102  soft-tissue]
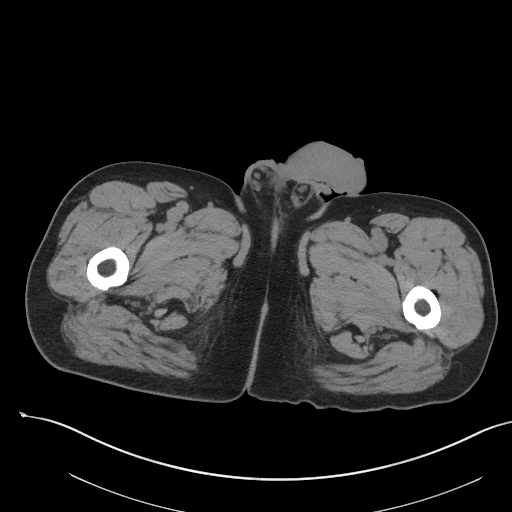
[im 6/102  bone]
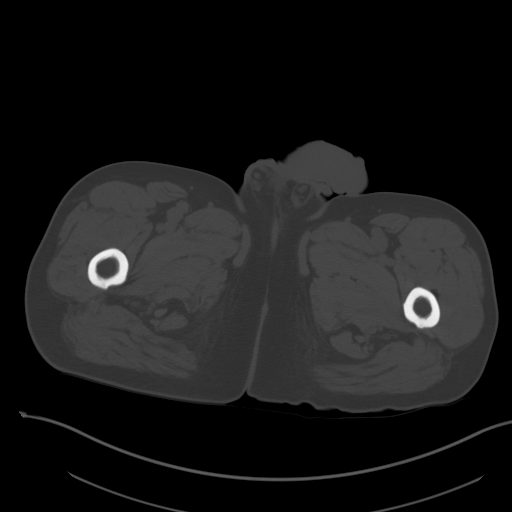
[im 18/102  soft-tissue]
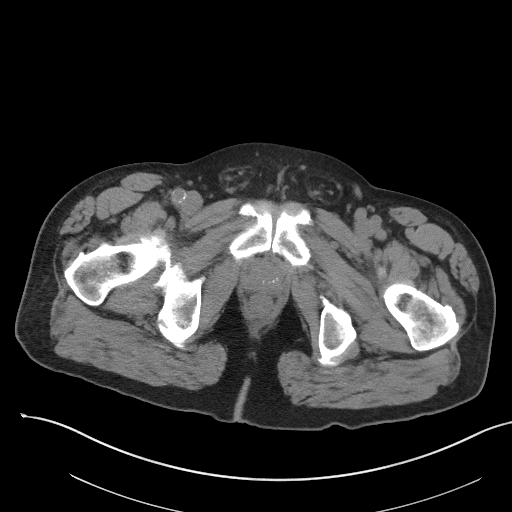
[im 24/102  soft-tissue]
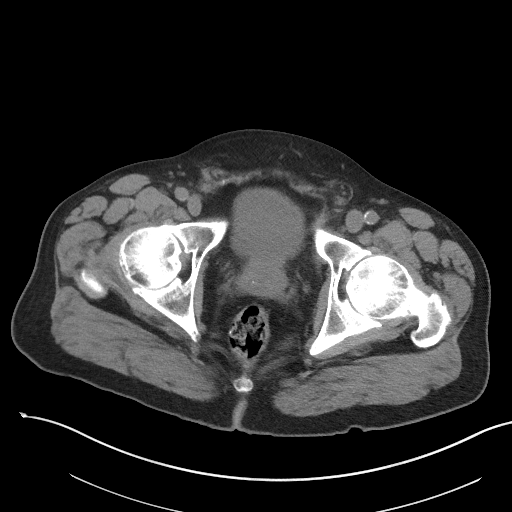
[im 30/102  soft-tissue]
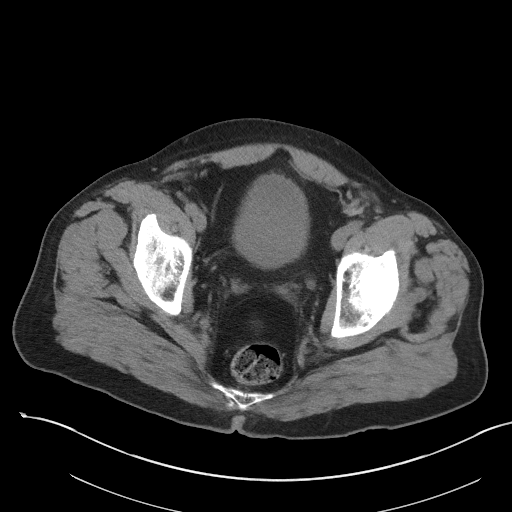
[im 42/102  soft-tissue]
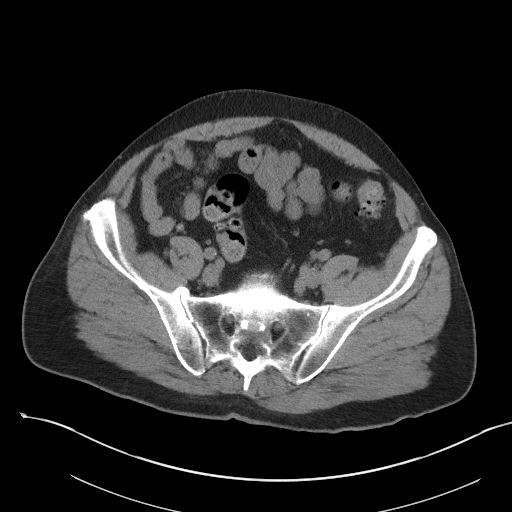
[im 48/102  soft-tissue]
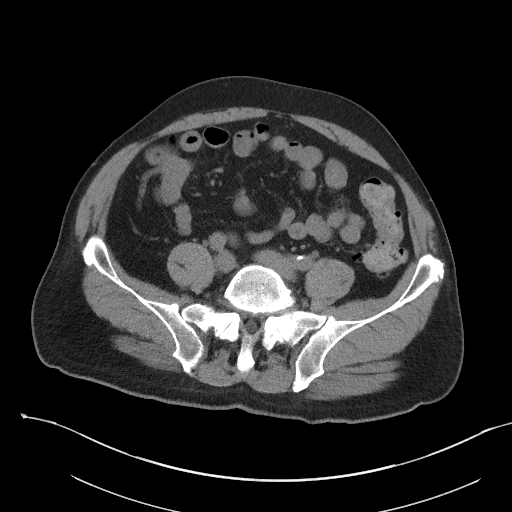
[im 54/102  soft-tissue]
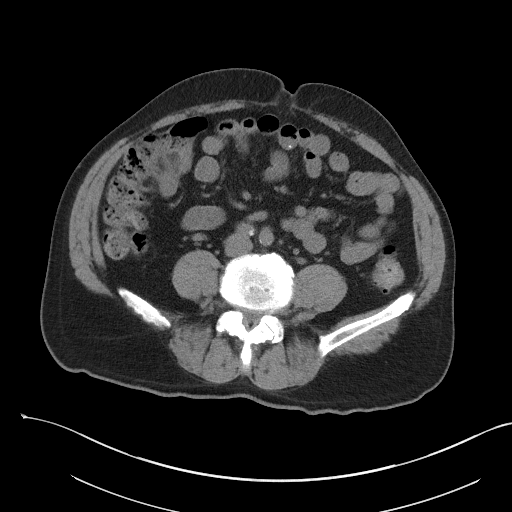
[im 66/102  soft-tissue]
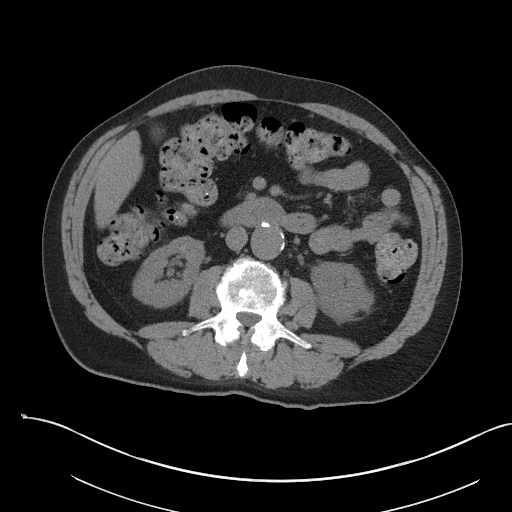
[im 72/102  soft-tissue]
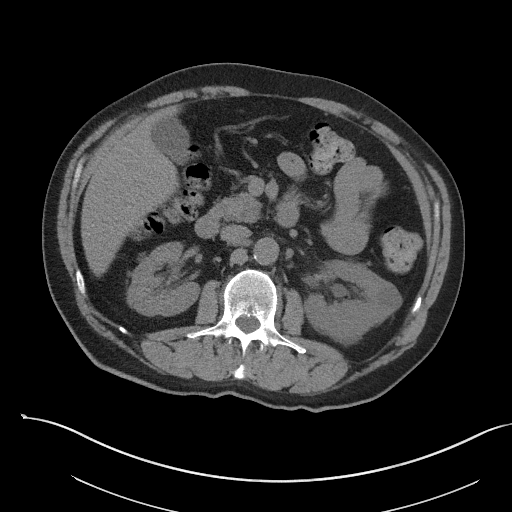
[im 72/102  bone]
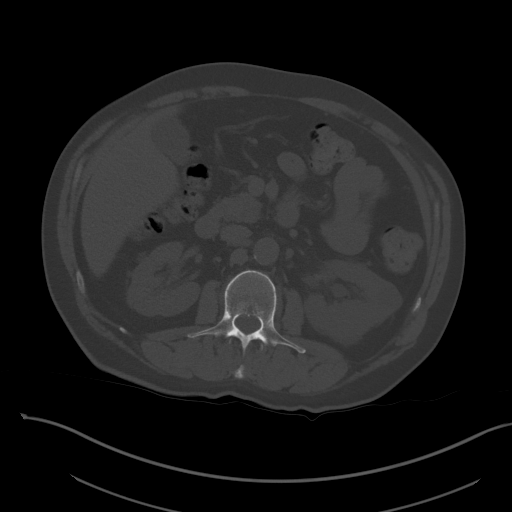
[im 78/102  soft-tissue]
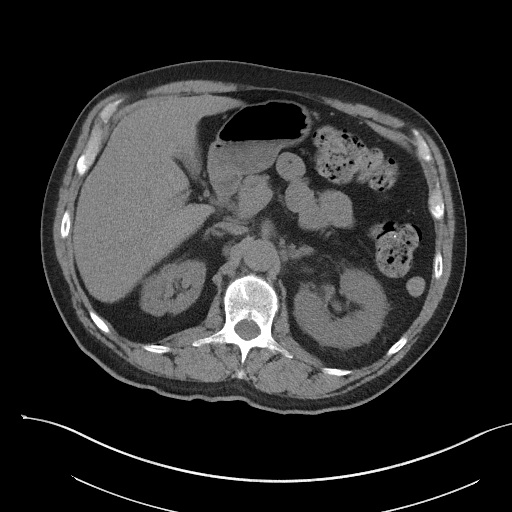
[im 90/102  soft-tissue]
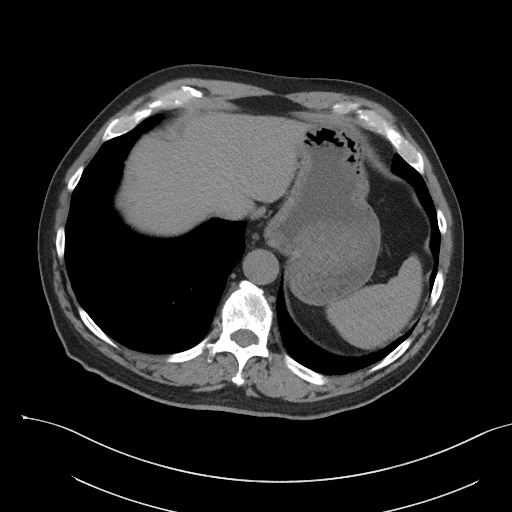
[im 96/102  soft-tissue]
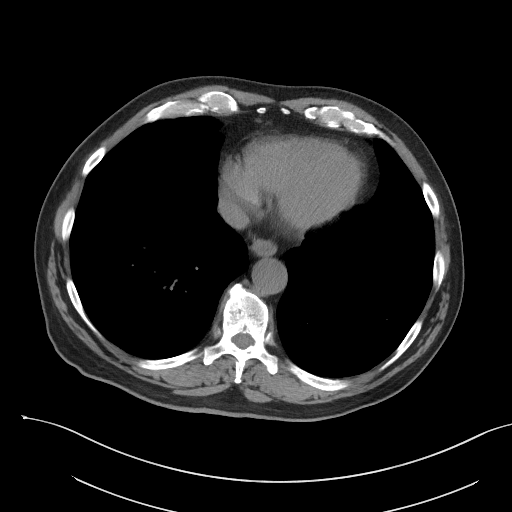

[Series 5: coronal st · coronal · 0.89mm/px · 3 of 114 slices shown]
[im 38/114  soft-tissue]
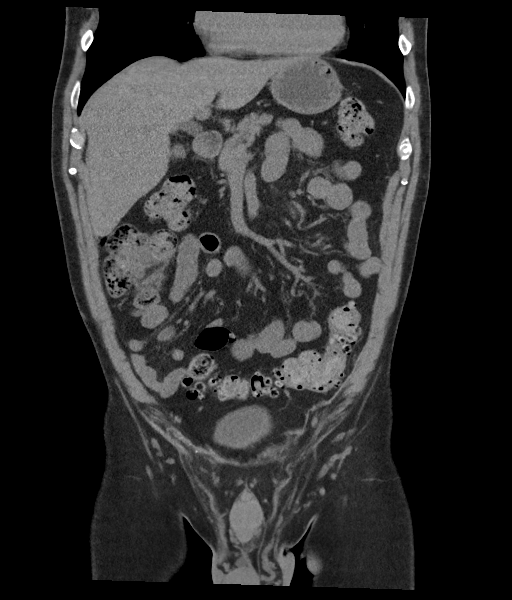
[im 51/114  soft-tissue]
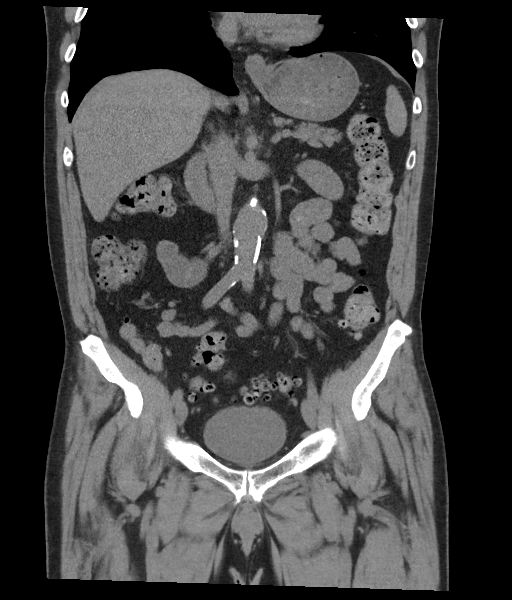
[im 63/114  soft-tissue]
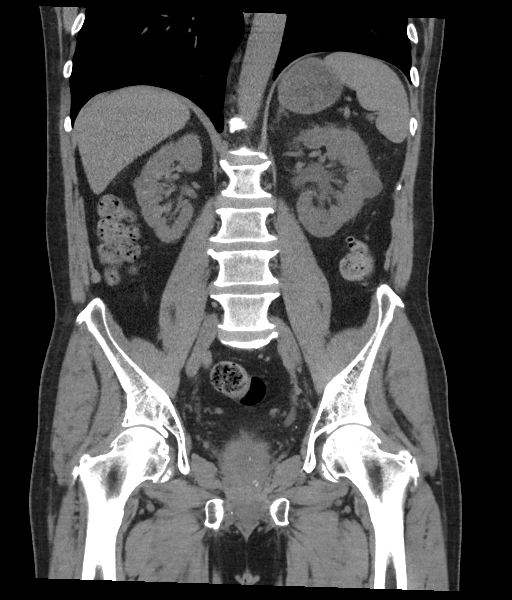

[15 of 46 positions shown; findings below may reference images not displayed]

FINDINGS: Lower chest: Mild descending thoracic aortic atherosclerotic
calcification.

Hepatobiliary: 0.8 by 0.6 cm hypodense lesion in segment 3 of the
liver on image 22 series 2, nonspecific. Gallbladder unremarkable.
No biliary dilatation.

Pancreas: Unremarkable

Spleen: Unremarkable

Adrenals/Urinary Tract: Both adrenal glands appear normal.

Mild left hydronephrosis mild left hydroureter extending down to a 2
mm in length left distal ureteral calculus located about 9 mm
proximal to the UVJ shown on image 82 series 6.

Left mid kidney hypodense lesion measuring 2.1 cm craniocaudad,
fluid density, appearance favors a cyst.

1.3 by 1.1 cm hypodense right mid kidney lesion has faint
calcification along its superior and inferior margin on image 63
series 5, and is most likely a small complex cyst although
enhancement characteristics are not interrogated.

Stomach/Bowel: Descending and sigmoid colon diverticulosis.
Prominent stool throughout the colon favors constipation. Normal
appendix.

Vascular/Lymphatic: Fusiform infrarenal abdominal aortic aneurysm
3.0 cm in transverse diameter on image 39 series 2.

Atherosclerosis is present, including aortoiliac atherosclerotic
disease.

Reproductive: Dystrophic calcifications in the prostate gland noted.

Other: No supplemental non-categorized findings.

Musculoskeletal: Anterior spurring of the sacroiliac joints. Mild
lumbar spondylosis and degenerative disc disease. Possible central
narrowing of the thecal sac at L4-5.
IMPRESSION: 1. 2 mm left distal ureteral calculus associated with left
hydronephrosis and left hydroureter. This calculus is located about
9 mm proximal to the left UVJ. No other urinary tract calculi
identified.
2. Faint rim calcification along a right mid kidney lesion which is
probably a minimally complex cyst. Fluid density left mid kidney
lesion is likewise probably a cyst.
3.  Prominent stool throughout the colon favors constipation.
4. Descending and sigmoid colon diverticulosis.
5. Infrarenal abdominal aortic aneurysm 3.0 cm in diameter.
Recommend follow-up ultrasound every 3 years. This recommendation
follows ACR consensus guidelines: White Paper of the ACR Incidental
Findings Committee II on Vascular Findings. [HOSPITAL] 7112;
[DATE].
6. 7 mm hypodense lesion in the liver is nonspecific but
statistically likely to be benign, assuming the patient does not
have a history of gastrointestinal malignancy.
7.  Aortic Atherosclerosis ([id number]).
8. Potential central narrowing of the thecal sac at L4-5 due to
spondylosis and degenerative disc disease.

## 2023-02-12 ENCOUNTER — Ambulatory Visit: Payer: Medicare Other | Attending: Cardiology | Admitting: Cardiology

## 2023-02-12 ENCOUNTER — Encounter: Payer: Self-pay | Admitting: Cardiology

## 2023-02-12 NOTE — Progress Notes (Deleted)
Date:  02/12/2023   ID:  Carl Jimenez, DOB 01/17/1956, MRN [id number] The patient was identified using 2 identifiers.  PCP:  Donetta Potts, MD   [facility] Providers Cardiologist:  Marjo Bicker, MD     Evaluation Performed:  New Patient Evaluation  Chief Complaint:  OSA  History of Present Illness:    Carl Jimenez is a 67 y.o. male with a history of COPD, depression, diabetes mellitus, hypertension.   He underwent home sleep study for excessive daytime sleepiness which showed mild obstructive sleep apnea with an AHI of 6.7 /h overall and 12.9/h during REM sleep.  Most events occurred during supine sleep.  He was started on auto CPAP from 4-20 cm H2O.   Past Medical History:  Diagnosis Date   COPD (chronic obstructive pulmonary disease) (HCC)    Depression    Diabetes mellitus without complication (HCC)    Hypertension    OSA (obstructive sleep apnea)    AHI of 6.7 /h overall and 12.9/h during REM sleep   Psoriasis    Past Surgical History:  Procedure Laterality Date   BIOPSY  06/15/2021   Procedure: BIOPSY;  Surgeon: Dolores Frame, MD;  [facility];  Service: Gastroenterology;;  gastric, distal, esophageal, mid-esophageal   BIOPSY  08/16/2021   Procedure: BIOPSY;  Surgeon: Dolores Frame, MD;  [facility];  Service: Gastroenterology;;   CATARACT EXTRACTION W/PHACO Left 07/29/2021   Procedure: CATARACT EXTRACTION PHACO AND INTRAOCULAR LENS PLACEMENT (IOC);  Surgeon: Fabio Pierce, MD;  Location: [facility];  Service: Ophthalmology;  Laterality: Left;  CDE 5.89   CATARACT EXTRACTION W/PHACO Right 08/12/2021   Procedure: CATARACT EXTRACTION PHACO AND INTRAOCULAR LENS PLACEMENT (IOC);  Surgeon: Fabio Pierce, MD;  Location: [facility];  Service: Ophthalmology;  Laterality: Right;  CDE: 6.69   ESOPHAGOGASTRODUODENOSCOPY (EGD) WITH PROPOFOL N/A 06/15/2021   Procedure: ESOPHAGOGASTRODUODENOSCOPY (EGD) WITH PROPOFOL;   Surgeon: Dolores Frame, MD;  [facility];  Service: Gastroenterology;  Laterality: N/A;   ESOPHAGOGASTRODUODENOSCOPY (EGD) WITH PROPOFOL N/A 08/16/2021   Procedure: ESOPHAGOGASTRODUODENOSCOPY (EGD) WITH PROPOFOL;  Surgeon: Dolores Frame, MD;  [facility];  Service: Gastroenterology;  Laterality: N/A;  905   HERNIA REPAIR     LEFT HEART CATH AND CORONARY ANGIOGRAPHY N/A 08/08/2022   Procedure: LEFT HEART CATH AND CORONARY ANGIOGRAPHY;  Surgeon: Swaziland, Peter M, MD;  Location: [facility] INVASIVE CV LAB;  Service: Cardiovascular;  Laterality: N/A;     No outpatient medications have been marked as taking for the 02/12/23 encounter (Appointment) with Quintella Reichert, MD.     Allergies:   Patient has no known allergies.   Social History   Tobacco Use   Smoking status: Never    Passive exposure: Never   Smokeless tobacco: Never  Vaping Use   Vaping status: Never Used  Substance Use Topics   Alcohol use: Never   Drug use: Never     Family Hx: The patient's family history is not on file.  ROS:   Please see the history of present illness.     All other systems reviewed and are negative.   Prior Sleep studies:   The following studies were reviewed today:  Home sleep study  Labs/Other Tests and Data Reviewed:     Recent Labs: 07/19/2022: BUN 15; Creatinine, Ser 0.92; Potassium 4.0; Sodium 142 08/02/2022: Hemoglobin 13.9; Platelets 214    Wt Readings from Last 3 Encounters:  01/26/23 202  lb (91.6 kg)  12/05/22 202 lb 8 oz (91.9 kg)  10/31/22 202 lb (91.6 kg)     Risk Assessment/Calculations:      STOP-Bang Score:  4  { Consider Dx Sleep Disordered Breathing or Sleep Apnea  ICD G47.33          :1}    Objective:    Vital Signs:  There were no vitals taken for this visit.   VITAL SIGNS:  reviewed GEN:  no acute distress EYES:  sclerae anicteric, EOMI - Extraocular Movements Intact RESPIRATORY:  normal respiratory effort, symmetric  expansion CARDIOVASCULAR:  no peripheral edema SKIN:  no rash, lesions or ulcers. MUSCULOSKELETAL:  no obvious deformities. NEURO:  alert and oriented x 3, no obvious focal deficit PSYCH:  normal affect  ASSESSMENT & PLAN:    OSA -The PAP download performed by his DME was personally reviewed and interpreted by me today and showed an AHI of 20 /hr on auto CPAP from 4-20 cm H2O with 0% compliance in using more than 4 hours nightly.  His download but shows a very large mask leak  Hypertension -BP has been controlled at home -Continue prescription drug management with losartan 50 mg daily, Lopressor 25 mg twice daily with as needed refills   Time:   Today, I have spent 15 minutes with the patient with telehealth technology discussing the above problems.     Medication Adjustments/Labs and Tests Ordered: Current medicines are reviewed at length with the patient today.  Concerns regarding medicines are outlined above.   Tests Ordered: No orders of the defined types were placed in this encounter.   Medication Changes: No orders of the defined types were placed in this encounter.   Follow Up:  In Person in 6 week(s) after getting his CPAP  Signed, Armanda Magic, MD  02/12/2023 3:11 PM    [facility]

## 2023-02-13 DIAGNOSIS — G4733 Obstructive sleep apnea (adult) (pediatric): Secondary | ICD-10-CM | POA: Diagnosis not present

## 2023-02-14 ENCOUNTER — Ambulatory Visit: Payer: Medicare Other | Admitting: Orthopedic Surgery

## 2023-02-14 ENCOUNTER — Other Ambulatory Visit: Payer: Self-pay

## 2023-02-14 ENCOUNTER — Encounter: Payer: Self-pay | Admitting: Orthopedic Surgery

## 2023-02-14 VITALS — BP 135/75 | HR 61 | Ht 72.0 in | Wt 198.8 lb

## 2023-02-14 DIAGNOSIS — M79645 Pain in left finger(s): Secondary | ICD-10-CM

## 2023-02-14 NOTE — Patient Instructions (Signed)
Consider taking ibuprofen for the next 7 days, regardless of how the hand feels, and then take it as needed.

## 2023-02-14 NOTE — Progress Notes (Signed)
New Patient Visit  Assessment: Carl Jimenez is a 67 y.o. male with the following: 1. Pain of left middle finger  Plan: Carl Jimenez has pain at the MCP joint of the long finger.  No specific injury.  Radiographs demonstrate some decreased joint space, as well as osteophytes.  This could represent an osteoarthritis flare.  This was discussed with the patient.  Recommend he continue to work on range of motion.  He can consider buddy taping to provide some stability to the long finger.  I have also recommended NSAIDs on a consistent basis, followed by as needed use.  If he continues to have pain in this finger, we could consider an injection, or potentially a referral to a hand specialist.  Follow-up: Return if symptoms worsen or fail to improve.  Subjective:  Chief Complaint  Patient presents with   Hand Pain    Bilat L > R at least 2 mos. No injury that he can think of.    History of Present Illness: Carl Jimenez is a 67 y.o. male who presents for evaluation of left hand pain.  He uses both hands.  He states he has had pain in the MCP to the long finger for the past couple months.  No specific injury.  He recently had similar type pains in the right hand, but this progressively improved.  He notes restricted motion.  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  However, he never had an injury while doing this.  The pain is constant.  He feels as though it is swollen.   Review of Systems: No fevers or chills No numbness or tingling No chest pain No shortness of breath No bowel or bladder dysfunction No GI distress No headaches   Medical History:  Past Medical History:  Diagnosis Date   COPD (chronic obstructive pulmonary disease) (HCC)    Depression    Diabetes mellitus without complication (HCC)    Hypertension    OSA (obstructive sleep apnea)    AHI of 6.7 /h overall and 12.9/h during REM sleep   Psoriasis     Past Surgical  History:  Procedure Laterality Date   BIOPSY  06/15/2021   Procedure: BIOPSY;  Surgeon: Dolores Frame, MD;  [facility];  Service: Gastroenterology;;  gastric, distal, esophageal, mid-esophageal   BIOPSY  08/16/2021   Procedure: BIOPSY;  Surgeon: Dolores Frame, MD;  [facility];  Service: Gastroenterology;;   CATARACT EXTRACTION W/PHACO Left 07/29/2021   Procedure: CATARACT EXTRACTION PHACO AND INTRAOCULAR LENS PLACEMENT (IOC);  Surgeon: Fabio Pierce, MD;  Location: [facility];  Service: Ophthalmology;  Laterality: Left;  CDE 5.89   CATARACT EXTRACTION W/PHACO Right 08/12/2021   Procedure: CATARACT EXTRACTION PHACO AND INTRAOCULAR LENS PLACEMENT (IOC);  Surgeon: Fabio Pierce, MD;  Location: [facility];  Service: Ophthalmology;  Laterality: Right;  CDE: 6.69   ESOPHAGOGASTRODUODENOSCOPY (EGD) WITH PROPOFOL N/A 06/15/2021   Procedure: ESOPHAGOGASTRODUODENOSCOPY (EGD) WITH PROPOFOL;  Surgeon: Dolores Frame, MD;  [facility];  Service: Gastroenterology;  Laterality: N/A;   ESOPHAGOGASTRODUODENOSCOPY (EGD) WITH PROPOFOL N/A 08/16/2021   Procedure: ESOPHAGOGASTRODUODENOSCOPY (EGD) WITH PROPOFOL;  Surgeon: Dolores Frame, MD;  [facility];  Service: Gastroenterology;  Laterality: N/A;  905   HERNIA REPAIR     LEFT HEART CATH AND CORONARY ANGIOGRAPHY N/A 08/08/2022   Procedure: LEFT HEART CATH AND CORONARY ANGIOGRAPHY;  Surgeon: Swazil[place], Peter M, MD;  Location: [facility] INVASIVE CV LAB;  Service: Cardiovascular;  Laterality: N/A;    No family history on file. Social History   Tobacco Use   Smoking status: Never    Passive exposure: Never   Smokeless tobacco: Never  Vaping Use   Vaping status: Never Used  Substance Use Topics   Alcohol use: Never   Drug use: Never    No Known Allergies  Current Meds  Medication Sig   albuterol (VENTOLIN HFA) 108 (90 Base) MCG/ACT inhaler Inhale 2 puffs into the lungs every 6  (six) hours as needed for wheezing or shortness of breath.   aspirin EC 81 MG tablet Take 81 mg by mouth daily. Swallow whole.   citalopram (CELEXA) 20 MG tablet Take 20 mg by mouth daily.   empagliflozin (JARDIANCE) 10 MG TABS tablet Take 10 mg by mouth daily.   losartan (COZAAR) 50 MG tablet Take 50 mg by mouth daily.   metFORMIN (GLUCOPHAGE-XR) 500 MG 24 hr tablet Take 500 mg by mouth 2 (two) times daily.   metoprolol tartrate (LOPRESSOR) 25 MG tablet Take 1 tablet (25 mg total) by mouth 2 (two) times daily. (Patient taking differently: Take 12.5 mg by mouth 2 (two) times daily.)   Multiple Vitamin (MULTIVITAMIN) tablet Take 1 tablet by mouth daily.   omeprazole (PRILOSEC) 40 MG capsule Take 1 capsule (40 mg total) by mouth daily.   rosuvastatin (CRESTOR) 20 MG tablet Take 20 mg by mouth at bedtime.   SPIRIVA HANDIHALER 18 MCG inhalation capsule Place 18 mcg into inhaler and inhale daily.    Objective: BP 135/75   Pulse 61   Ht 6' (1.829 m)   Wt 198 lb 12.8 oz (90.2 kg)   BMI 26.96 kg/m   Physical Exam:  General: Alert and oriented. and No acute distress. Gait: Normal gait.  Evaluation of the left hand demonstrates mild swelling to the long finger MCP joint.  He has tenderness to palpation on the dorsal aspect of the joint.  There is some tenderness to palpation over the A1 pulley.  No triggering is appreciated.  He has restricted flexion of the long finger.  No other deformity throughout the left hand.  Fingers are warm and well-perfused.  Sensation intact throughout his left hand.  IMAGING: I personally ordered and reviewed the following images  X-rays left hand were obtained in clinic today.  There is decreased joint space at the MCP joint to the long finger.  There are some associated osteophytes, and small cysts.  Remainder of the hand is without injury.  Minimal degenerative changes otherwise.  Impression: Left hand x-ray with mild to moderate degenerative changes of the  long finger MCP joint.   New Medications:  No orders of the defined types were placed in this encounter.     Oliver Barre, MD  02/14/2023 10:39 AM

## 2023-03-16 ENCOUNTER — Ambulatory Visit: Payer: Medicare Other | Admitting: Internal Medicine

## 2023-04-13 ENCOUNTER — Ambulatory Visit: Payer: Medicare Other | Admitting: Internal Medicine

## 2023-04-25 DIAGNOSIS — M9903 Segmental and somatic dysfunction of lumbar region: Secondary | ICD-10-CM | POA: Diagnosis not present

## 2023-04-25 DIAGNOSIS — M9902 Segmental and somatic dysfunction of thoracic region: Secondary | ICD-10-CM | POA: Diagnosis not present

## 2023-04-25 DIAGNOSIS — M9901 Segmental and somatic dysfunction of cervical region: Secondary | ICD-10-CM | POA: Diagnosis not present

## 2023-04-25 DIAGNOSIS — M62830 Muscle spasm of back: Secondary | ICD-10-CM | POA: Diagnosis not present

## 2023-04-25 DIAGNOSIS — M546 Pain in thoracic spine: Secondary | ICD-10-CM | POA: Diagnosis not present

## 2023-04-25 DIAGNOSIS — M542 Cervicalgia: Secondary | ICD-10-CM | POA: Diagnosis not present

## 2023-04-27 DIAGNOSIS — M62830 Muscle spasm of back: Secondary | ICD-10-CM | POA: Diagnosis not present

## 2023-04-27 DIAGNOSIS — M9901 Segmental and somatic dysfunction of cervical region: Secondary | ICD-10-CM | POA: Diagnosis not present

## 2023-04-27 DIAGNOSIS — M9902 Segmental and somatic dysfunction of thoracic region: Secondary | ICD-10-CM | POA: Diagnosis not present

## 2023-04-27 DIAGNOSIS — M9903 Segmental and somatic dysfunction of lumbar region: Secondary | ICD-10-CM | POA: Diagnosis not present

## 2023-04-27 DIAGNOSIS — M546 Pain in thoracic spine: Secondary | ICD-10-CM | POA: Diagnosis not present

## 2023-04-27 DIAGNOSIS — M542 Cervicalgia: Secondary | ICD-10-CM | POA: Diagnosis not present

## 2023-04-30 DIAGNOSIS — M9903 Segmental and somatic dysfunction of lumbar region: Secondary | ICD-10-CM | POA: Diagnosis not present

## 2023-04-30 DIAGNOSIS — M542 Cervicalgia: Secondary | ICD-10-CM | POA: Diagnosis not present

## 2023-04-30 DIAGNOSIS — M9902 Segmental and somatic dysfunction of thoracic region: Secondary | ICD-10-CM | POA: Diagnosis not present

## 2023-04-30 DIAGNOSIS — M62830 Muscle spasm of back: Secondary | ICD-10-CM | POA: Diagnosis not present

## 2023-04-30 DIAGNOSIS — M546 Pain in thoracic spine: Secondary | ICD-10-CM | POA: Diagnosis not present

## 2023-04-30 DIAGNOSIS — M9901 Segmental and somatic dysfunction of cervical region: Secondary | ICD-10-CM | POA: Diagnosis not present

## 2023-05-04 ENCOUNTER — Ambulatory Visit: Payer: Medicare Other | Attending: Internal Medicine | Admitting: Internal Medicine

## 2023-05-04 ENCOUNTER — Encounter: Payer: Self-pay | Admitting: Internal Medicine

## 2023-05-04 VITALS — BP 134/76 | HR 44 | Ht 72.0 in | Wt 197.0 lb

## 2023-05-04 DIAGNOSIS — I10 Essential (primary) hypertension: Secondary | ICD-10-CM

## 2023-05-04 DIAGNOSIS — I2510 Atherosclerotic heart disease of native coronary artery without angina pectoris: Secondary | ICD-10-CM

## 2023-05-04 NOTE — Progress Notes (Signed)
Cardiology Office Note  Date: 05/04/2023   ID: Carl Jimenez, DOB 1956-04-18, MRN [id number]  PCP:  Donetta Potts, MD  Cardiologist:  Marjo Bicker, MD Electrophysiologist:  None    History of Present Illness: Carl Jimenez is a 67 y.o. male known to have known to have CAD manifested by abnormal CCTA status post single-vessel CAD on medical management, HTN, DM 2, COPD, HLD, mild OSA presented to cardiology clinic for follow-up visit.  Patient was initially referred to cardiology clinic for evaluation of incidental finding of moderate coronary artery calcifications on CT chest lung cancer screening. Patient underwent CCTA that showed focal stenosis of distal LCx, FFR 0.66. He reported having decrease in stamina levels for the last 1 year, DOE with strenuous activities and also substernal resting chest pains lasting for 15 to 20 minutes occurring 3 times per week (but not with exertion). He underwent LHC in 07/2022 that showed 95% LCx stenosis but no PCI was performed as patient had class I anginal symptoms. Plan was to perform PCI only if patient has medically refractory angina.  He is here for follow-up visit.   Continues to have chest pains mostly at rest and improved compared to before.  Otherwise no angina, DOE, orthopnea, PND, leg swelling, palpitations, dizziness, lightheadedness and syncope.  Does not smoke cigarettes.  He quit around 5 years ago. He underwent WatchPAT study in the last clinic visit and was diagnosed with mild OSA.  Past Medical History:  Diagnosis Date   COPD (chronic obstructive pulmonary disease) (HCC)    Depression    Diabetes mellitus without complication (HCC)    Hypertension    OSA (obstructive sleep apnea)    AHI of 6.7 /h overall and 12.9/h during REM sleep   Psoriasis     Past Surgical History:  Procedure Laterality Date   BIOPSY  06/15/2021   Procedure: BIOPSY;  Surgeon: Dolores Frame, MD;  [facility];  Service:  Gastroenterology;;  gastric, distal, esophageal, mid-esophageal   BIOPSY  08/16/2021   Procedure: BIOPSY;  Surgeon: Dolores Frame, MD;  [facility];  Service: Gastroenterology;;   CATARACT EXTRACTION W/PHACO Left 07/29/2021   Procedure: CATARACT EXTRACTION PHACO AND INTRAOCULAR LENS PLACEMENT (IOC);  Surgeon: Fabio Pierce, MD;  Location: [facility];  Service: Ophthalmology;  Laterality: Left;  CDE 5.89   CATARACT EXTRACTION W/PHACO Right 08/12/2021   Procedure: CATARACT EXTRACTION PHACO AND INTRAOCULAR LENS PLACEMENT (IOC);  Surgeon: Fabio Pierce, MD;  Location: [facility];  Service: Ophthalmology;  Laterality: Right;  CDE: 6.69   ESOPHAGOGASTRODUODENOSCOPY (EGD) WITH PROPOFOL N/A 06/15/2021   Procedure: ESOPHAGOGASTRODUODENOSCOPY (EGD) WITH PROPOFOL;  Surgeon: Dolores Frame, MD;  [facility];  Service: Gastroenterology;  Laterality: N/A;   ESOPHAGOGASTRODUODENOSCOPY (EGD) WITH PROPOFOL N/A 08/16/2021   Procedure: ESOPHAGOGASTRODUODENOSCOPY (EGD) WITH PROPOFOL;  Surgeon: Dolores Frame, MD;  [facility];  Service: Gastroenterology;  Laterality: N/A;  905   HERNIA REPAIR     LEFT HEART CATH AND CORONARY ANGIOGRAPHY N/A 08/08/2022   Procedure: LEFT HEART CATH AND CORONARY ANGIOGRAPHY;  Surgeon: [place], Peter M, MD;  Location: [facility] INVASIVE CV LAB;  Service: Cardiovascular;  Laterality: N/A;    Current Outpatient Medications  Medication Sig Dispense Refill   albuterol (VENTOLIN HFA) 108 (90 Base) MCG/ACT inhaler Inhale 2 puffs into the lungs every 6 (six) hours as needed for wheezing or shortness of breath.     aspirin EC 81 MG tablet Take 81 mg by  mouth daily. Swallow whole.     citalopram (CELEXA) 20 MG tablet Take 20 mg by mouth daily.     empagliflozin (JARDIANCE) 10 MG TABS tablet Take 10 mg by mouth daily.     losartan (COZAAR) 50 MG tablet Take 50 mg by mouth daily.     metFORMIN (GLUCOPHAGE-XR) 500 MG 24 hr tablet Take 500 mg by  mouth 2 (two) times daily.     Multiple Vitamin (MULTIVITAMIN) tablet Take 1 tablet by mouth daily.     nitroGLYCERIN (NITROSTAT) 0.4 MG SL tablet Place 1 tablet (0.4 mg total) under the tongue every 5 (five) minutes as needed for chest pain. f a single episode of chest pain is not relieved by one tablet, the patient will try another within 5 minutes; and if this doesn't relieve the pain, the patient is instructed to call 911 for transportation to an emergency department. 25 tablet 3   omeprazole (PRILOSEC) 40 MG capsule Take 1 capsule (40 mg total) by mouth daily. 90 capsule 3   rosuvastatin (CRESTOR) 20 MG tablet Take 20 mg by mouth at bedtime.     SPIRIVA HANDIHALER 18 MCG inhalation capsule Place 18 mcg into inhaler and inhale daily.     No current facility-administered medications for this visit.   Allergies:  Patient has no known allergies.   Social History: The patient  reports that he has never smoked. He has never been exposed to tobacco smoke. He has never used smokeless tobacco. He reports that he does not drink alcohol and does not use drugs.   Family History: The patient's family history is not on file.   ROS:  Please see the history of present illness. Otherwise, complete review of systems is positive for none.  All other systems are reviewed and negative.   Physical Exam: VS:  BP 134/76   Pulse (!) 44   Ht 6' (1.829 m)   Wt 197 lb (89.4 kg)   SpO2 97%   BMI 26.72 kg/m , BMI Body mass index is 26.72 kg/m.  Wt Readings from Last 3 Encounters:  05/04/23 197 lb (89.4 kg)  02/14/23 198 lb 12.8 oz (90.2 kg)  01/26/23 202 lb (91.6 kg)    General: Patient appears comfortable at rest. HEENT: Conjunctiva and lids normal, oropharynx clear with moist mucosa. Neck: Supple, no elevated JVP or carotid bruits, no thyromegaly. Lungs: Clear to auscultation, nonlabored breathing at rest. Cardiac: Regular rate and rhythm, no S3 or significant systolic murmur, no pericardial  rub. Abdomen: Soft, nontender, no hepatomegaly, bowel sounds present, no guarding or rebound. Extremities: No pitting edema, distal pulses 2+. Skin: Warm and dry. Musculoskeletal: No kyphosis. Neuropsychiatric: Alert and oriented x3, affect grossly appropriate.  ECG:  An ECG dated 07/18/2022 was personally reviewed today and demonstrated:  Normal sinus rhythm and no ST changes  Recent Labwork: 07/19/2022: BUN 15; Creatinine, Ser 0.92; Potassium 4.0; Sodium 142 08/02/2022: Hemoglobin 13.9; Platelets 214  No results found for: "CHOL", "TRIG", "HDL", "CHOLHDL", "VLDL", "LDLCALC", "LDLDIRECT"   Assessment and Plan:   # CAD manifested by abnormal CCTA in 06/2022 s/p single-vessel CAD (95% LCx disease), angina free -No interval angina, has chest pains mostly at rest.  Continue cardioprotective medications with aspirin 81 mg once daily, rosuvastatin 20 mg nightly.  If he develops angina future, will need repeat LHC for LCx PCI.  SL NTG 0.4 mg as needed.  # HTN, controlled -Discontinue metoprolol due to severe sinus bradycardia, continue losartan 50 mg once daily.  #  HLD -Continue statin 20 mg nightly, goal LDL less than 70.  # Mild OSA -Patient underwent WatchPAT testing recently and was diagnosed with mild OSA, follow-up with sleep medicine.  I have spent a total of 30 minutes with patient reviewing chart, EKGs, labs and examining patient as well as establishing an assessment and plan that was discussed with the patient.   Medication Adjustments/Labs and Tests Ordered: Current medicines are reviewed at length with the patient today.  Concerns regarding medicines are outlined above.    Disposition:  Follow up 1 year  Signed Boleslaw Borghi Verne Spurr, MD, 05/04/2023 11:35 AM    [facility] at [facility] [address] [place], [place], [place] [address]

## 2023-05-04 NOTE — Patient Instructions (Signed)
Medication Instructions:  Your physician has recommended you make the following change in your medication:   -Stop Metoprolol   *If you need a refill on your cardiac medications before your next appointment, please call your pharmacy*   Lab Work: None If you have labs (blood work) drawn today and your tests are completely normal, you will receive your results only by: MyChart Message (if you have MyChart) OR A paper copy in the mail If you have any lab test that is abnormal or we need to change your treatment, we will call you to review the results.   Testing/Procedures: None   Follow-Up: At [facility], you and your health needs are our priority.  As part of our continuing mission to provide you with exceptional heart care, we have created designated Provider Care Teams.  These Care Teams include your primary Cardiologist (physician) and Advanced Practice Providers (APPs -  Physician Assistants and Nurse Practitioners) who all work together to provide you with the care you need, when you need it.  We recommend signing up for the patient portal called "MyChart".  Sign up information is provided on this After Visit Summary.  MyChart is used to connect with patients for Virtual Visits (Telemedicine).  Patients are able to view lab/test results, encounter notes, upcoming appointments, etc.  Non-urgent messages can be sent to your provider as well.   To learn more about what you can do with MyChart, go to [email or web address].    Your next appointment:   1 year(s)  Provider:   You may see Vishnu P Mallipeddi, MD or one of the following Advanced Practice Providers on your designated Care Team:   [place], PA-C  Jacolyn Reedy, [place]     Other Instructions

## 2023-05-07 DIAGNOSIS — M9901 Segmental and somatic dysfunction of cervical region: Secondary | ICD-10-CM | POA: Diagnosis not present

## 2023-05-07 DIAGNOSIS — M9903 Segmental and somatic dysfunction of lumbar region: Secondary | ICD-10-CM | POA: Diagnosis not present

## 2023-05-07 DIAGNOSIS — M9902 Segmental and somatic dysfunction of thoracic region: Secondary | ICD-10-CM | POA: Diagnosis not present

## 2023-05-07 DIAGNOSIS — M542 Cervicalgia: Secondary | ICD-10-CM | POA: Diagnosis not present

## 2023-05-07 DIAGNOSIS — M546 Pain in thoracic spine: Secondary | ICD-10-CM | POA: Diagnosis not present

## 2023-05-07 DIAGNOSIS — M62830 Muscle spasm of back: Secondary | ICD-10-CM | POA: Diagnosis not present

## 2023-05-16 DIAGNOSIS — M542 Cervicalgia: Secondary | ICD-10-CM | POA: Diagnosis not present

## 2023-05-16 DIAGNOSIS — M62830 Muscle spasm of back: Secondary | ICD-10-CM | POA: Diagnosis not present

## 2023-05-16 DIAGNOSIS — M9903 Segmental and somatic dysfunction of lumbar region: Secondary | ICD-10-CM | POA: Diagnosis not present

## 2023-05-16 DIAGNOSIS — M9902 Segmental and somatic dysfunction of thoracic region: Secondary | ICD-10-CM | POA: Diagnosis not present

## 2023-05-16 DIAGNOSIS — M546 Pain in thoracic spine: Secondary | ICD-10-CM | POA: Diagnosis not present

## 2023-05-16 DIAGNOSIS — M9901 Segmental and somatic dysfunction of cervical region: Secondary | ICD-10-CM | POA: Diagnosis not present

## 2023-05-21 DIAGNOSIS — M9903 Segmental and somatic dysfunction of lumbar region: Secondary | ICD-10-CM | POA: Diagnosis not present

## 2023-05-21 DIAGNOSIS — M542 Cervicalgia: Secondary | ICD-10-CM | POA: Diagnosis not present

## 2023-05-21 DIAGNOSIS — M9901 Segmental and somatic dysfunction of cervical region: Secondary | ICD-10-CM | POA: Diagnosis not present

## 2023-05-21 DIAGNOSIS — M9902 Segmental and somatic dysfunction of thoracic region: Secondary | ICD-10-CM | POA: Diagnosis not present

## 2023-05-21 DIAGNOSIS — M62830 Muscle spasm of back: Secondary | ICD-10-CM | POA: Diagnosis not present

## 2023-05-21 DIAGNOSIS — M546 Pain in thoracic spine: Secondary | ICD-10-CM | POA: Diagnosis not present

## 2023-06-06 DIAGNOSIS — M62830 Muscle spasm of back: Secondary | ICD-10-CM | POA: Diagnosis not present

## 2023-06-06 DIAGNOSIS — M546 Pain in thoracic spine: Secondary | ICD-10-CM | POA: Diagnosis not present

## 2023-06-06 DIAGNOSIS — M9902 Segmental and somatic dysfunction of thoracic region: Secondary | ICD-10-CM | POA: Diagnosis not present

## 2023-06-06 DIAGNOSIS — M9903 Segmental and somatic dysfunction of lumbar region: Secondary | ICD-10-CM | POA: Diagnosis not present

## 2023-06-06 DIAGNOSIS — M542 Cervicalgia: Secondary | ICD-10-CM | POA: Diagnosis not present

## 2023-06-06 DIAGNOSIS — M9901 Segmental and somatic dysfunction of cervical region: Secondary | ICD-10-CM | POA: Diagnosis not present

## 2023-06-18 DIAGNOSIS — I10 Essential (primary) hypertension: Secondary | ICD-10-CM | POA: Diagnosis not present

## 2023-06-18 DIAGNOSIS — E559 Vitamin D deficiency, unspecified: Secondary | ICD-10-CM | POA: Diagnosis not present

## 2023-06-18 DIAGNOSIS — J449 Chronic obstructive pulmonary disease, unspecified: Secondary | ICD-10-CM | POA: Diagnosis not present

## 2023-06-18 DIAGNOSIS — B070 Plantar wart: Secondary | ICD-10-CM | POA: Diagnosis not present

## 2023-06-18 DIAGNOSIS — E1169 Type 2 diabetes mellitus with other specified complication: Secondary | ICD-10-CM | POA: Diagnosis not present

## 2023-06-18 DIAGNOSIS — Z1329 Encounter for screening for other suspected endocrine disorder: Secondary | ICD-10-CM | POA: Diagnosis not present

## 2023-06-18 DIAGNOSIS — L409 Psoriasis, unspecified: Secondary | ICD-10-CM | POA: Diagnosis not present

## 2023-06-18 DIAGNOSIS — I700 Atherosclerosis of aorta: Secondary | ICD-10-CM | POA: Diagnosis not present

## 2023-06-18 DIAGNOSIS — Z0001 Encounter for general adult medical examination with abnormal findings: Secondary | ICD-10-CM | POA: Diagnosis not present

## 2023-06-26 ENCOUNTER — Encounter: Payer: Self-pay | Admitting: Internal Medicine

## 2023-06-28 DIAGNOSIS — Z122 Encounter for screening for malignant neoplasm of respiratory organs: Secondary | ICD-10-CM | POA: Diagnosis not present

## 2023-06-28 DIAGNOSIS — Z87891 Personal history of nicotine dependence: Secondary | ICD-10-CM | POA: Diagnosis not present

## 2023-07-04 DIAGNOSIS — M542 Cervicalgia: Secondary | ICD-10-CM | POA: Diagnosis not present

## 2023-07-04 DIAGNOSIS — M9903 Segmental and somatic dysfunction of lumbar region: Secondary | ICD-10-CM | POA: Diagnosis not present

## 2023-07-04 DIAGNOSIS — M9901 Segmental and somatic dysfunction of cervical region: Secondary | ICD-10-CM | POA: Diagnosis not present

## 2023-07-04 DIAGNOSIS — M546 Pain in thoracic spine: Secondary | ICD-10-CM | POA: Diagnosis not present

## 2023-07-04 DIAGNOSIS — M9902 Segmental and somatic dysfunction of thoracic region: Secondary | ICD-10-CM | POA: Diagnosis not present

## 2023-07-04 DIAGNOSIS — M62830 Muscle spasm of back: Secondary | ICD-10-CM | POA: Diagnosis not present

## 2023-08-20 DIAGNOSIS — L409 Psoriasis, unspecified: Secondary | ICD-10-CM | POA: Diagnosis not present

## 2023-08-20 DIAGNOSIS — E1169 Type 2 diabetes mellitus with other specified complication: Secondary | ICD-10-CM | POA: Diagnosis not present

## 2023-08-20 DIAGNOSIS — E559 Vitamin D deficiency, unspecified: Secondary | ICD-10-CM | POA: Diagnosis not present

## 2023-10-04 DIAGNOSIS — J449 Chronic obstructive pulmonary disease, unspecified: Secondary | ICD-10-CM | POA: Diagnosis not present

## 2023-10-04 DIAGNOSIS — E1169 Type 2 diabetes mellitus with other specified complication: Secondary | ICD-10-CM | POA: Diagnosis not present

## 2023-10-04 DIAGNOSIS — E559 Vitamin D deficiency, unspecified: Secondary | ICD-10-CM | POA: Diagnosis not present

## 2023-10-04 DIAGNOSIS — L409 Psoriasis, unspecified: Secondary | ICD-10-CM | POA: Diagnosis not present

## 2023-10-14 ENCOUNTER — Other Ambulatory Visit (INDEPENDENT_AMBULATORY_CARE_PROVIDER_SITE_OTHER): Payer: Self-pay | Admitting: Gastroenterology

## 2023-10-14 DIAGNOSIS — K219 Gastro-esophageal reflux disease without esophagitis: Secondary | ICD-10-CM

## 2023-11-26 ENCOUNTER — Encounter (INDEPENDENT_AMBULATORY_CARE_PROVIDER_SITE_OTHER): Payer: Self-pay | Admitting: Gastroenterology

## 2023-11-26 ENCOUNTER — Ambulatory Visit (INDEPENDENT_AMBULATORY_CARE_PROVIDER_SITE_OTHER): Admitting: Gastroenterology

## 2023-11-26 VITALS — BP 135/74 | HR 56 | Temp 97.1°F | Ht 72.0 in | Wt 194.9 lb

## 2023-11-26 DIAGNOSIS — K219 Gastro-esophageal reflux disease without esophagitis: Secondary | ICD-10-CM | POA: Diagnosis not present

## 2023-11-26 DIAGNOSIS — K31A0 Gastric intestinal metaplasia, unspecified: Secondary | ICD-10-CM

## 2023-11-26 NOTE — Progress Notes (Signed)
 Samantha Cress, M.D. Gastroenterology & Hepatology [facility] [address] [place], [place] [address]  Primary Care Physician: Lauran Pollard, MD [address] [place] [address]  I will communicate my assessment and recommendations to the referring MD via EMR.  Problems: GERD History of food impaction  History of Present Illness: Byrant Valent is a 68 y.o. male with past medical history of DM, HTN, high cholesterol, psoriasis, COPD, GERD, who presents for follow up of GERD.  The patient was last seen on 12/05/2018 for. At that time, the patient was continued on omeprazole  40 mg daily.  Also advised to have repeat EGD in 2026 for gastric mapping.  He reports that as long as he takes omeprazole  40 mg, he does not have any heartburn or dysphagia.  The patient denies having any nausea, vomiting, fever, chills, hematochezia, melena, hematemesis, abdominal distention, abdominal pain, diarrhea, jaundice, pruritus or weight loss.  Last Colonoscopy:>10 years, patient does not want to have another colonoscopy    Last Endoscopy:07/2021 - 1 cm hiatal hernia.                           - Salmon-colored mucosa suspicious for                            short-segment Barrett's esophagus. Biopsied.                           - Normal stomach. Biopsied.                           - Erythematous duodenopathy. (intestinal metaplasia in gastric biopsies but negative for H. pylori, changes of reflux esophagitis but no presence of Barrett's esophagus, repeat EGD in 3 years for gastric mapping.)    Past Medical History: Past Medical History:  Diagnosis Date   COPD (chronic obstructive pulmonary disease) (HCC)    Depression    Diabetes mellitus without complication (HCC)    Hypertension    OSA (obstructive sleep apnea)    AHI of 6.7 /h overall and 12.9/h during REM sleep   Psoriasis     Past Surgical History: Past Surgical History:  Procedure  Laterality Date   BIOPSY  06/15/2021   Procedure: BIOPSY;  Surgeon: Urban Garden, MD;  [facility];  Service: Gastroenterology;;  gastric, distal, esophageal, mid-esophageal   BIOPSY  08/16/2021   Procedure: BIOPSY;  Surgeon: Urban Garden, MD;  [facility];  Service: Gastroenterology;;   CATARACT EXTRACTION W/PHACO Left 07/29/2021   Procedure: CATARACT EXTRACTION PHACO AND INTRAOCULAR LENS PLACEMENT (IOC);  Surgeon: Tarri Farm, MD;  Location: [facility];  Service: Ophthalmology;  Laterality: Left;  CDE 5.89   CATARACT EXTRACTION W/PHACO Right 08/12/2021   Procedure: CATARACT EXTRACTION PHACO AND INTRAOCULAR LENS PLACEMENT (IOC);  Surgeon: Tarri Farm, MD;  Location: [facility];  Service: Ophthalmology;  Laterality: Right;  CDE: 6.69   ESOPHAGOGASTRODUODENOSCOPY (EGD) WITH PROPOFOL  N/A 06/15/2021   Procedure: ESOPHAGOGASTRODUODENOSCOPY (EGD) WITH PROPOFOL ;  Surgeon: Urban Garden, MD;  [facility];  Service: Gastroenterology;  Laterality: N/A;   ESOPHAGOGASTRODUODENOSCOPY (EGD) WITH PROPOFOL  N/A 08/16/2021   Procedure: ESOPHAGOGASTRODUODENOSCOPY (EGD) WITH PROPOFOL ;  Surgeon: Urban Garden, MD;  [facility];  Service: Gastroenterology;  Laterality: N/A;  905   HERNIA REPAIR  LEFT HEART CATH AND CORONARY ANGIOGRAPHY N/A 08/08/2022   Procedure: LEFT HEART CATH AND CORONARY ANGIOGRAPHY;  Surgeon: [place], Peter M, MD;  Location: [facility] INVASIVE CV LAB;  Service: Cardiovascular;  Laterality: N/A;    Family History:History reviewed. No pertinent family history.  Social History: Social History   Tobacco Use  Smoking Status Never   Passive exposure: Never  Smokeless Tobacco Never   Social History   Substance and Sexual Activity  Alcohol Use Yes   Comment: seldom   Social History   Substance and Sexual Activity  Drug Use Yes   Comment: cbd smokes daily.    Allergies: No Known  Allergies  Medications: Current Outpatient Medications  Medication Sig Dispense Refill   albuterol (VENTOLIN HFA) 108 (90 Base) MCG/ACT inhaler Inhale 2 puffs into the lungs every 6 (six) hours as needed for wheezing or shortness of breath.     aspirin  EC 81 MG tablet Take 81 mg by mouth daily. Swallow whole.     citalopram (CELEXA) 20 MG tablet Take 20 mg by mouth daily.     empagliflozin (JARDIANCE) 10 MG TABS tablet Take 10 mg by mouth daily.     losartan (COZAAR) 100 MG tablet Take 100 mg by mouth daily.     metFORMIN (GLUCOPHAGE-XR) 500 MG 24 hr tablet Take 500 mg by mouth 2 (two) times daily.     Multiple Vitamin (MULTIVITAMIN) tablet Take 1 tablet by mouth daily.     nitroGLYCERIN  (NITROSTAT ) 0.4 MG SL tablet Place 1 tablet (0.4 mg total) under the tongue every 5 (five) minutes as needed for chest pain. f a single episode of chest pain is not relieved by one tablet, the patient will try another within 5 minutes; and if this doesn't relieve the pain, the patient is instructed to call 911 for transportation to an emergency department. 25 tablet 3   omeprazole  (PRILOSEC) 40 MG capsule TAKE 1 CAPSULE BY MOUTH DAILY 90 capsule 2   rosuvastatin (CRESTOR) 20 MG tablet Take 20 mg by mouth at bedtime.     SPIRIVA HANDIHALER 18 MCG inhalation capsule Place 18 mcg into inhaler and inhale daily.     No current facility-administered medications for this visit.    Review of Systems: GENERAL: negative for malaise, night sweats HEENT: No changes in hearing or vision, no nose bleeds or other nasal problems. NECK: Negative for lumps, goiter, pain and significant neck swelling RESPIRATORY: Negative for cough, wheezing CARDIOVASCULAR: Negative for chest pain, leg swelling, palpitations, orthopnea GI: SEE HPI MUSCULOSKELETAL: Negative for joint pain or swelling, back pain, and muscle pain. SKIN: Negative for lesions, rash PSYCH: Negative for sleep disturbance, mood disorder and recent psychosocial  stressors. HEMATOLOGY Negative for prolonged bleeding, bruising easily, and swollen nodes. ENDOCRINE: Negative for cold or heat intolerance, polyuria, polydipsia and goiter. NEURO: negative for tremor, gait imbalance, syncope and seizures. The remainder of the review of systems is noncontributory.   Physical Exam: BP 135/74 (BP Location: Left Arm, Patient Position: Sitting, Cuff Size: Normal)   Pulse (!) 56   Temp (!) 97.1 F (36.2 C) (Temporal)   Ht 6' (1.829 m)   Wt 194 lb 14.4 oz (88.4 kg)   BMI 26.43 kg/m  GENERAL: The patient is AO x3, in no acute distress. HEENT: Head is normocephalic and atraumatic. EOMI are intact. Mouth is well hydrated and without lesions. NECK: Supple. No masses LUNGS: Clear to auscultation. No presence of rhonchi/wheezing/rales. Adequate chest expansion HEART: RRR, normal s1 and s2. ABDOMEN: Soft,  nontender, no guarding, no peritoneal signs, and nondistended. BS +. No masses. EXTREMITIES: Without any cyanosis, clubbing, rash, lesions or edema. NEUROLOGIC: AOx3, no focal motor deficit. SKIN: no jaundice, no rashes  Imaging/Labs: as above  I personally reviewed and interpreted the available labs, imaging and endoscopic files.  Impression and Plan: Avir Deruiter is a 68 y.o. male with past medical history of DM, HTN, high cholesterol, psoriasis, COPD, GERD, who presents for follow up of GERD.  Patient has been doing well while taking moderate dose PPI on a regular basis.  He should continue taking this as it has prevented recurrent esophagitis/stricturing, which led to previous episode of food impaction.  He is asymptomatic and we will continue at the same doses for now.  Finally, I discussed the benefits from proceeding with colorectal cancer screening as he is overdue for this.  He understood the benefits from this but will think about the best timing to proceed with this, and will let us  know when he is ready to schedule.  -Continue omeprazole  40 mg  every day - The patient should let us  know if interested in pursuing screening colonoscopy  All questions were answered.      Samantha Cress, MD Gastroenterology and Hepatology [facility] Gastroenterology

## 2023-11-26 NOTE — Patient Instructions (Addendum)
 Continue omeprazole  40 mg every day Please let us  know if interested in pursuing screening colonoscopy

## 2023-12-06 ENCOUNTER — Ambulatory Visit (INDEPENDENT_AMBULATORY_CARE_PROVIDER_SITE_OTHER): Payer: Medicare Other | Admitting: Gastroenterology

## 2024-01-17 DIAGNOSIS — E1169 Type 2 diabetes mellitus with other specified complication: Secondary | ICD-10-CM | POA: Diagnosis not present

## 2024-01-17 DIAGNOSIS — I10 Essential (primary) hypertension: Secondary | ICD-10-CM | POA: Diagnosis not present

## 2024-01-17 DIAGNOSIS — J449 Chronic obstructive pulmonary disease, unspecified: Secondary | ICD-10-CM | POA: Diagnosis not present

## 2024-03-13 DIAGNOSIS — M25511 Pain in right shoulder: Secondary | ICD-10-CM | POA: Diagnosis not present

## 2024-04-09 ENCOUNTER — Encounter (INDEPENDENT_AMBULATORY_CARE_PROVIDER_SITE_OTHER): Payer: Self-pay | Admitting: Gastroenterology

## 2024-04-15 ENCOUNTER — Other Ambulatory Visit (INDEPENDENT_AMBULATORY_CARE_PROVIDER_SITE_OTHER): Payer: Self-pay | Admitting: Gastroenterology

## 2024-04-15 DIAGNOSIS — K219 Gastro-esophageal reflux disease without esophagitis: Secondary | ICD-10-CM

## 2024-04-30 NOTE — Progress Notes (Signed)
 Carl Jimenez                                          MRN: [id number]   04/30/2024   The VBCI Quality Team Specialist reviewed this patient medical record for the purposes of chart review for care gap closure. The following were reviewed: chart review for care gap closure-kidney health evaluation for diabetes:eGFR  and uACR.    VBCI Quality Team

## 2024-07-17 ENCOUNTER — Encounter (INDEPENDENT_AMBULATORY_CARE_PROVIDER_SITE_OTHER): Payer: Self-pay | Admitting: *Deleted

## 2024-07-30 NOTE — Progress Notes (Unsigned)
 " Cardiology Office Note:  .   Date:  07/30/2024  ID:  Alm Side, DOB 07-19-1955, MRN [id number] PCP: Trudy Vaughn FALCON, MD  [facility] Providers Cardiologist:  Diannah SHAUNNA [place], MD {  History of Present Illness: .   Carl Jimenez is a 69 y.o. male  with PMHx of CAD, HLD, HTN, DM2, COPD, mild OSA who reports to [facility] office for overdue 1 year follow-up.   Pertinent cardiac medical history:  Cardiac CTA 06/2022 showed CAC of 176 with 57th percentile, severe CAD noted in distal LCx with 70 to 99% stenosis, remainder arteries with residual stenosis, FFR positive for distal LCx. Cath 07/2022 showed 95% stenosis in mid to distal LCx, 20% stenosis in proximal to distal RCA, normal LV function and LVEDP; recommended medical management with class I anginal symptoms, however also noted if refractory symptoms then PCI would be indicated.  Last seen in heartcare 04/2023 by Dr. Mallipeddi and reported ongoing chest pain mostly at rest, however has improved compared to previous.  Also noted diagnosis of mild OSA in the interim.  Discontinued Lopressor  due to severe sinus bradycardia of 44.  Continued on ASA 81 mg daily, Crestor 20 mg daily, NTG as needed, Jardiance 10 mg daily (for DM), losartan 50 mg daily.    Recent hospitalization   Today, reports ### and denies ###.  Denies chest pain, shortness of breath, palpitations, syncope, presyncope, dizziness, orthopnea, PND, swelling or significant weight changes, acute bleeding, or claudication.   Reports compliance with medications.  Dietary habitats:  Activity level:  Social: Denies tobacco use/alcohol/drug use  Denies any recent hospitalizations or visits to the emergency department.   Coronary artery disease involving native coronary artery of native heart, unspecified whether angina present  If he develops angina in the future despite antianginal, will need repeat LHC for possible LCx PCI.  Hyperlipidemia LDL goal <70   HTN,  goal below 130/80 Reports well controlled Home BP:  BP this OV well controlled today:  Continue on / Managed by GDMT as above.  Previously discontinued Lopressor  due to bradycardia. Encourage physical activity for 150 minutes per week and heart healthy low sodium diet. Discussed limiting sodium intake to < 2 grams daily.     OSA (obstructive sleep apnea) Encourage compliance with CPAP. Follow-up with sleep medicine.  ROS: 10 point review of system has been reviewed and considered negative except ones been listed in the HPI.   Studies Reviewed: SABRA    Cath 07/2022   Mid Cx to Dist Cx lesion is 95% stenosed.   Prox RCA to Dist RCA lesion is 20% stenosed.   The left ventricular systolic function is normal.   LV end diastolic pressure is normal.   The left ventricular ejection fraction is 55-65% by visual estimate.   Single vessel occlusive CAD involving the mid to distal LCx. Normal LV function Normal LVEDP   Plan: recommend optimal medical therapy. If patient has refractory symptoms despite optimal medical therapy then PCI would be indicated. In my interview today he describes only class 1 symptoms.   Diagnostic Dominance: Right   CV Studies: Cardiac studies reviewed are outlined and summarized above. Otherwise please see EMR for full report.   Risk Assessment/Calculations:   {Does this patient have ATRIAL FIBRILLATION?:[id number]} No BP recorded.  {Refresh Note OR Click here to enter BP  :1}***   STOP-Bang Score:     { Consider Dx Sleep Disordered Breathing or Sleep Apnea  ICD G47.33          :  1}    Physical Exam:   VS:  There were no vitals taken for this visit.   Wt Readings from Last 3 Encounters:  11/26/23 194 lb 14.4 oz (88.4 kg)  05/04/23 197 lb (89.4 kg)  02/14/23 198 lb 12.8 oz (90.2 kg)    GEN: Well nourished, well developed in no acute distress while sitting in chair.  NECK: No JVD; No carotid bruits CARDIAC: ***RRR, no murmurs, rubs, gallops RESPIRATORY:   Clear to auscultation without rales, wheezing or rhonchi  ABDOMEN: Soft, non-tender, non-distended EXTREMITIES:  No edema; No deformity   ASSESSMENT AND PLAN: .   ***    {Are you ordering a CV Procedure (e.g. stress test, cath, DCCV, TEE, etc)?   Press F2        :[id number]}  Dispo: ***  Signed, Lorette CINDERELLA Kapur, PA-C  "

## 2024-07-31 ENCOUNTER — Ambulatory Visit: Admitting: Physician Assistant

## 2024-07-31 DIAGNOSIS — I2510 Atherosclerotic heart disease of native coronary artery without angina pectoris: Secondary | ICD-10-CM

## 2024-07-31 DIAGNOSIS — I10 Essential (primary) hypertension: Secondary | ICD-10-CM

## 2024-07-31 DIAGNOSIS — G4733 Obstructive sleep apnea (adult) (pediatric): Secondary | ICD-10-CM

## 2024-07-31 DIAGNOSIS — E785 Hyperlipidemia, unspecified: Secondary | ICD-10-CM
